# Patient Record
Sex: Female | Born: 1980 | Race: White | Hispanic: No | Marital: Married | Smoking: Former smoker
Health system: Southern US, Community
[De-identification: ages and names within clinical notes are randomized; demographics above are authoritative.]

## PROBLEM LIST (undated history)

## (undated) DIAGNOSIS — G43109 Migraine with aura, not intractable, without status migrainosus: Secondary | ICD-10-CM

## (undated) DIAGNOSIS — R87629 Unspecified abnormal cytological findings in specimens from vagina: Secondary | ICD-10-CM

## (undated) DIAGNOSIS — F419 Anxiety disorder, unspecified: Secondary | ICD-10-CM

## (undated) DIAGNOSIS — M1990 Unspecified osteoarthritis, unspecified site: Secondary | ICD-10-CM

## (undated) HISTORY — DX: Unspecified osteoarthritis, unspecified site: M19.90

## (undated) HISTORY — DX: Anxiety disorder, unspecified: F41.9

## (undated) HISTORY — DX: Migraine with aura, not intractable, without status migrainosus: G43.109

## (undated) HISTORY — PX: TUBAL LIGATION: SHX77

## (undated) HISTORY — PX: DILATION AND CURETTAGE OF UTERUS: SHX78

## (undated) HISTORY — DX: Unspecified abnormal cytological findings in specimens from vagina: R87.629

## (undated) HISTORY — PX: LEEP: SHX91

---

## 2014-06-11 ENCOUNTER — Ambulatory Visit (INDEPENDENT_AMBULATORY_CARE_PROVIDER_SITE_OTHER): Payer: BC Managed Care – PPO | Admitting: Sports Medicine

## 2014-06-11 ENCOUNTER — Encounter: Payer: Self-pay | Admitting: Sports Medicine

## 2014-06-11 ENCOUNTER — Ambulatory Visit (INDEPENDENT_AMBULATORY_CARE_PROVIDER_SITE_OTHER): Payer: BC Managed Care – PPO

## 2014-06-11 ENCOUNTER — Other Ambulatory Visit: Payer: Self-pay | Admitting: Sports Medicine

## 2014-06-11 VITALS — BP 119/81 | HR 71 | Ht 67.0 in | Wt 150.0 lb

## 2014-06-11 DIAGNOSIS — M5416 Radiculopathy, lumbar region: Secondary | ICD-10-CM

## 2014-06-11 DIAGNOSIS — M533 Sacrococcygeal disorders, not elsewhere classified: Secondary | ICD-10-CM | POA: Insufficient documentation

## 2014-06-11 MED ORDER — CYCLOBENZAPRINE HCL 10 MG PO TABS: ORAL_TABLET | ORAL | Status: DC

## 2014-06-11 MED ORDER — MELOXICAM 15 MG PO TABS: ORAL_TABLET | ORAL | Status: DC

## 2014-06-11 NOTE — Progress Notes (Signed)
   Subjective:    I'm seeing this patient as a consultation for:  Dr. Jacques NavyJennifer Sobon, [facility].  CC: Low back pain  HPI: This is a pleasant [age] year old female, she has had a long history of low back pain for years, she denies any trauma or constitutional symptoms. More recently it has started radiate down both legs, left worse than right, in an L5 distribution.  She also ascribes the pain is discogenic, worse with sitting, flexion, and Valsalva. Denies any bowel or bladder dysfunction, she has never had physical therapy, advanced imaging, or injections. Pain is moderate, persistent.  Past medical history, Surgical history, Family history not pertinant except as noted below, Social history, Allergies, and medications have been entered into the medical record, reviewed, and no changes needed.   Review of Systems: No headache, visual changes, nausea, vomiting, diarrhea, constipation, dizziness, abdominal pain, skin rash, fevers, chills, night sweats, weight loss, swollen lymph nodes, body aches, joint swelling, muscle aches, chest pain, shortness of breath, mood changes, visual or auditory hallucinations.   Objective:   General: Well Developed, well nourished, and in no acute distress.  Neuro/Psych: Alert and oriented x3, extra-ocular muscles intact, able to move all 4 extremities, sensation grossly intact. Skin: Warm and dry, no rashes noted.  Respiratory: Not using accessory muscles, speaking in full sentences, trachea midline.  Cardiovascular: Pulses palpable, no extremity edema. Abdomen: Does not appear distended. Back Exam:  Inspection: Unremarkable  Motion: Flexion 45 deg, Extension 45 deg, Side Bending to 45 deg bilaterally,  Rotation to 45 deg bilaterally  SLR laying: Positive on the left side with reproduction of radicular symptoms in an L5 distribution to the plantar aspect of the left foot.  XSLR laying: Negative  Palpable tenderness: Left sacroiliac  joint. FABER: negative. Sensory change: Gross sensation intact to all lumbar and sacral dermatomes.  Reflexes: 2+ at both patellar tendons, 2+ at achilles tendons, Babinski's downgoing.  Strength at foot  Plantar-flexion: 5/5 Dorsi-flexion: 5/5 Eversion: 5/5 Inversion: 5/5  Leg strength  Quad: 5/5 Hamstring: 5/5 Hip flexor: 5/5 Hip abductors: 5/5  Gait unremarkable.   X-rays reviewed and show L3-L4 endplate degenerative changes, there is also L5 on S1 retrolisthesis.  Impression and Recommendations:   This case required medical decision making of moderate complexity.

## 2014-06-11 NOTE — Assessment & Plan Note (Addendum)
Clinically represents a left L5 distribution. She also has some left-sided sacroiliac joint pain. Meloxicam, Flexeril at bedtime, x-rays, MRI considering duration of pain and for interventional planning. Formal physical therapy.  Return to see me to go over MRI results. Also return for custom orthotics. These have been helpful before for her low back pain.

## 2014-06-15 ENCOUNTER — Encounter: Payer: Self-pay | Admitting: Physician Assistant

## 2014-06-15 ENCOUNTER — Ambulatory Visit (INDEPENDENT_AMBULATORY_CARE_PROVIDER_SITE_OTHER): Payer: BC Managed Care – PPO | Admitting: Physician Assistant

## 2014-06-15 VITALS — BP 92/63 | HR 73 | Temp 98.6°F | Ht 67.0 in | Wt 152.0 lb

## 2014-06-15 DIAGNOSIS — M5416 Radiculopathy, lumbar region: Secondary | ICD-10-CM | POA: Diagnosis not present

## 2014-06-15 DIAGNOSIS — F411 Generalized anxiety disorder: Secondary | ICD-10-CM | POA: Diagnosis not present

## 2014-06-15 DIAGNOSIS — M549 Dorsalgia, unspecified: Secondary | ICD-10-CM | POA: Insufficient documentation

## 2014-06-16 ENCOUNTER — Other Ambulatory Visit: Payer: Self-pay | Admitting: Sports Medicine

## 2014-06-16 ENCOUNTER — Ambulatory Visit (INDEPENDENT_AMBULATORY_CARE_PROVIDER_SITE_OTHER): Payer: BC Managed Care – PPO | Admitting: Sports Medicine

## 2014-06-16 ENCOUNTER — Encounter: Payer: Self-pay | Admitting: Sports Medicine

## 2014-06-16 ENCOUNTER — Telehealth: Payer: Self-pay | Admitting: *Deleted

## 2014-06-16 VITALS — BP 110/65 | HR 62 | Ht 67.0 in | Wt 152.0 lb

## 2014-06-16 DIAGNOSIS — M5416 Radiculopathy, lumbar region: Secondary | ICD-10-CM

## 2014-06-16 MED ORDER — GABAPENTIN 300 MG PO CAPS: ORAL_CAPSULE | ORAL | Status: DC

## 2014-06-16 NOTE — Telephone Encounter (Signed)
MRI approval Berkley Harveyauth [id number] valid 06/12/14-07/11/14. Radiology notified.

## 2014-06-16 NOTE — Addendum Note (Signed)
Addended by: Monica BectonHEKKEKANDAM, THOMAS J on: 06/16/2014 11:44 AM   Modules accepted: Orders

## 2014-06-16 NOTE — Progress Notes (Signed)

## 2014-06-16 NOTE — Assessment & Plan Note (Addendum)
Left L5. Awaiting MRI. Adding gabapentin at bedtime. Museum/gallery exhibitions officerCustom Orthotics as above. Return for MRI results.

## 2014-06-17 ENCOUNTER — Encounter: Payer: Self-pay | Admitting: Physician Assistant

## 2014-06-17 DIAGNOSIS — F411 Generalized anxiety disorder: Secondary | ICD-10-CM | POA: Insufficient documentation

## 2014-06-17 MED ORDER — SERTRALINE HCL 50 MG PO TABS: 50.0000 mg | ORAL_TABLET | Freq: Every day | ORAL | Status: DC

## 2014-06-17 NOTE — Progress Notes (Signed)
   Subjective:    Patient ID: Laurie Velasquez, female    DOB: 04/01/1981, 34 y.o.   MRN: [id number]  HPI Patient is a 34 year old female who would like to establish care today.  .. Active Ambulatory Problems    Diagnosis Date Noted  . Left lumbar radiculopathy 06/11/2014  . Back pain 06/15/2014   Resolved Ambulatory Problems    Diagnosis Date Noted  . No Resolved Ambulatory Problems   No Additional Past Medical History   .Marland Kitchen.No family history on file. .. History   Social History  . Marital Status: Married    Spouse Name: N/A    Number of Children: N/A  . Years of Education: N/A   Occupational History  . Not on file.   Social History Main Topics  . Smoking status: Never Smoker   . Smokeless tobacco: Not on file  . Alcohol Use: Not on file  . Drug Use: Not on file  . Sexual Activity: Not on file   Other Topics Concern  . Not on file   Social History Narrative   Patient does have some ongoing left lumbar radiculopathy that she has seen Dr. Karie Schwalbe for.  She does have some ongoing anxiety. She takes Zoloft 50 mg once a day. She feels like her symptoms are fairly well controlled.   Review of Systems  All other systems reviewed and are negative.      Objective:   Physical Exam  Constitutional: She is oriented to person, place, and time. She appears well-developed and well-nourished.  HENT:  Head: Normocephalic and atraumatic.  Cardiovascular: Normal rate, regular rhythm and normal heart sounds.   Pulmonary/Chest: Effort normal and breath sounds normal.  Neurological: She is alert and oriented to person, place, and time.  Psychiatric: She has a normal mood and affect. Her behavior is normal.          Assessment & Plan:  GAD- GAD-7 was 7. She does not need a refill today. She's been on Zoloft for the last 3 or 4 years. Discussed she may need a increase. Patient declined any increase or change today. Will follow-up as needed. Encouraged patient to follow-up in 3 months  for complete physical.  Left lumbar radiculopathy-managed by Dr. Karie Schwalbe.  Needs CPE and labs.

## 2014-06-22 ENCOUNTER — Ambulatory Visit (INDEPENDENT_AMBULATORY_CARE_PROVIDER_SITE_OTHER): Payer: BC Managed Care – PPO

## 2014-06-22 DIAGNOSIS — M47896 Other spondylosis, lumbar region: Secondary | ICD-10-CM

## 2014-06-22 DIAGNOSIS — M5416 Radiculopathy, lumbar region: Secondary | ICD-10-CM

## 2014-06-23 ENCOUNTER — Ambulatory Visit (INDEPENDENT_AMBULATORY_CARE_PROVIDER_SITE_OTHER): Payer: BC Managed Care – PPO | Admitting: Physical Therapy

## 2014-06-23 DIAGNOSIS — M545 Low back pain: Secondary | ICD-10-CM

## 2014-06-23 DIAGNOSIS — M6281 Muscle weakness (generalized): Secondary | ICD-10-CM

## 2014-06-23 DIAGNOSIS — M5416 Radiculopathy, lumbar region: Secondary | ICD-10-CM

## 2014-06-25 ENCOUNTER — Encounter: Payer: Self-pay | Admitting: Sports Medicine

## 2014-06-25 ENCOUNTER — Ambulatory Visit (INDEPENDENT_AMBULATORY_CARE_PROVIDER_SITE_OTHER): Payer: BC Managed Care – PPO | Admitting: Sports Medicine

## 2014-06-25 VITALS — BP 116/72 | HR 68 | Wt 153.0 lb

## 2014-06-25 DIAGNOSIS — M545 Low back pain, unspecified: Secondary | ICD-10-CM

## 2014-06-25 NOTE — Assessment & Plan Note (Signed)
Pain does tend to radiate down the left leg, and she is very tender over the left sacroiliac joint. Lumbar spine MRI is completely negative. This suggests that the left sacroiliac joint is the pain generator. Continue formal physical therapy, return in one month, and if still having pain we will do a left sacroiliac joint injection under ultrasound guidance.

## 2014-06-25 NOTE — Progress Notes (Signed)
  Subjective:    CC: MRI results  HPI: Laurie Velasquez returns, she's had low back pain for some time now without any workup, she's never had physical therapy. Due to the duration of her back pain we did obtain an MRI, the results will be dictated below. Her pain is localized over the left sacroiliac joint and it did radiate down the back of her leg.  Past medical history, Surgical history, Family history not pertinant except as noted below, Social history, Allergies, and medications have been entered into the medical record, reviewed, and no changes needed.   Review of Systems: No fevers, chills, night sweats, weight loss, chest pain, or shortness of breath.   Objective:    General: Well Developed, well nourished, and in no acute distress.  Neuro: Alert and oriented x3, extra-ocular muscles intact, sensation grossly intact.  HEENT: Normocephalic, atraumatic, pupils equal round reactive to light, neck supple, no masses, no lymphadenopathy, thyroid nonpalpable.  Skin: Warm and dry, no rashes. Cardiac: Regular rate and rhythm, no murmurs rubs or gallops, no lower extremity edema.  Respiratory: Clear to auscultation bilaterally. Not using accessory muscles, speaking in full sentences. Back Exam:  Inspection: Unremarkable  Motion: Flexion 45 deg, Extension 45 deg, Side Bending to 45 deg bilaterally,  Rotation to 45 deg bilaterally  SLR laying: Negative  XSLR laying: Negative  Palpable tenderness: Left sacroiliac joint. FABER: negative. Sensory change: Gross sensation intact to all lumbar and sacral dermatomes.  Reflexes: 2+ at both patellar tendons, 2+ at achilles tendons, Babinski's downgoing.  Strength at foot  Plantar-flexion: 5/5 Dorsi-flexion: 5/5 Eversion: 5/5 Inversion: 5/5  Leg strength  Quad: 5/5 Hamstring: 5/5 Hip flexor: 5/5 Hip abductors: 5/5  Gait unremarkable.  MRI is completely negative.  Impression and Recommendations:

## 2014-06-29 ENCOUNTER — Encounter (INDEPENDENT_AMBULATORY_CARE_PROVIDER_SITE_OTHER): Payer: BC Managed Care – PPO | Admitting: Physical Therapy

## 2014-06-29 DIAGNOSIS — M6281 Muscle weakness (generalized): Secondary | ICD-10-CM

## 2014-06-29 DIAGNOSIS — M545 Low back pain: Secondary | ICD-10-CM

## 2014-06-29 DIAGNOSIS — M5416 Radiculopathy, lumbar region: Secondary | ICD-10-CM

## 2014-07-09 ENCOUNTER — Encounter (INDEPENDENT_AMBULATORY_CARE_PROVIDER_SITE_OTHER): Payer: BC Managed Care – PPO | Admitting: Physical Therapy

## 2014-07-09 DIAGNOSIS — M545 Low back pain: Secondary | ICD-10-CM | POA: Diagnosis not present

## 2014-07-09 DIAGNOSIS — M6281 Muscle weakness (generalized): Secondary | ICD-10-CM | POA: Diagnosis not present

## 2014-07-09 DIAGNOSIS — M5416 Radiculopathy, lumbar region: Secondary | ICD-10-CM | POA: Diagnosis not present

## 2014-07-13 ENCOUNTER — Encounter: Payer: BC Managed Care – PPO | Admitting: Physical Therapy

## 2014-07-23 ENCOUNTER — Ambulatory Visit (INDEPENDENT_AMBULATORY_CARE_PROVIDER_SITE_OTHER): Payer: BC Managed Care – PPO | Admitting: Physical Therapy

## 2014-07-23 ENCOUNTER — Encounter: Payer: Self-pay | Admitting: Physical Therapy

## 2014-07-23 DIAGNOSIS — M6281 Muscle weakness (generalized): Secondary | ICD-10-CM | POA: Diagnosis not present

## 2014-07-23 DIAGNOSIS — M545 Low back pain, unspecified: Secondary | ICD-10-CM

## 2014-07-23 NOTE — Patient Instructions (Signed)
Hip Extension (All-Fours)   Lift right leg back with knee slightly flexed. Do not arch neck or back. Repeat __10__ times per set. Do _2-3___ sets per session. Do __1__ sessions per day.  http://orth.exer.us/106   Copyright  VHI. All rights reserved.  Arm / Leg Extension: Alternate (All-Fours)   Raise right arm and opposite leg. Do not arch neck. Repeat __10__ times per set. Do _2-3___ sets per session. Do _1___ sessions per day.  http://orth.exer.us/110   Copyright  VHI. All rights reserved.  Hip Hike   Stand on step, right leg off step, knee straight. Raise unsupported hip, keeping knee straight. Repeat __10__ times per set. Do __3__ sets per session. Do __1__ sessions per day.  http://orth.exer.us/692   Copyright  VHI. All rights reserved.  Single Leg Raise   Lie on back, one leg bent, other leg straight on mat. Inhale, raising straight leg toward ceiling. Keep hips on mat. Exhale, lowering leg to mat. Repeat _10-20___ times. Repeat with other leg. Do __1__ sessions per day.  Copyright  VHI. All rights reserved.  The Hundred   Lie on back, legs bent, arms toward ceiling. Exhale, pressing arms down to sides, curling up head and upper torso. Hold. Pump arms in small flutters up and down. __5__ pumps per inhale, _5___ pumps per exhale. Repeat _10___ times. Do __1__ sessions per day.  Copyright  VHI. All rights reserved.

## 2014-07-23 NOTE — Therapy (Signed)
[facility] [place] [address] [place], [place], [address] Phone: [phone]   Fax:  [phone]  Physical Therapy Treatment  Patient Details  Name: Laurie Velasquez MRN: [id number] Date of Birth: [birth year] Referring Provider:  Monica Becton,*  Encounter Date: 07/23/2014      PT End of Session - 07/23/14 1616    Visit Number 4   Number of Visits 4   Date for PT Re-Evaluation 07/21/14   PT Start Time 1616   PT Stop Time 1716   PT Time Calculation (min) 60 min      History reviewed. No pertinent past medical history.  History reviewed. No pertinent past surgical history.  There were no vitals taken for this visit.  Visit Diagnosis:  Muscle weakness (generalized)  Pain of lumbar spine      Subjective Assessment - 07/23/14 1618    Symptoms patient reports tightness first thing in AM, tender in bilat SIJ's.   Patient Stated Goals reports she can sit and stand longer than when she started PT, 20-30% improvement.    Currently in Pain? Yes   Pain Score 6   no more pain in the outside of her hips   Pain Location Sacrum   Pain Descriptors / Indicators Dull;Other (Comment)  pinching   Pain Type Chronic pain   Pain Radiating Towards lower back   Pain Frequency Constant          [facility] PT Assessment - 07/23/14 0001    Assessment   Medical Diagnosis Lt lumbar radicupathy   Onset Date 07/23/08   Balance Screen   Has the patient fallen in the past 6 months No   Has the patient had a decrease in activity level because of a fear of falling?  No   Is the patient reluctant to leave their home because of a fear of falling?  No   Observation/Other Assessments   Focus on Therapeutic Outcomes (FOTO)  37%   Posture/Postural Control   Posture/Postural Control --  Rt LE slightly longer than Lt   ROM / Strength   AROM / PROM / Strength Strength   Strength   Strength Assessment Site Hip   Right/Left Hip Right;Left   Right Hip Flexion 4+/5   Right Hip Extension 4+/5   Right Hip ABduction 4/5   Left Hip Flexion 4/5   Left Hip Extension 4+/5   Left Hip ABduction 3+/5                  [facility] Adult PT Treatment/Exercise - 07/23/14 0001    Exercises   Exercises Lumbar;Knee/Hip   Lumbar Exercises: Stretches   ITB Stretch --  cross body stretch with strap supine, resting leg on a chair   Lumbar Exercises: Supine   Other Supine Lumbar Exercises pilates, 100's & single leg raise   Lumbar Exercises: Quadruped   Straight Leg Raise 20 reps  bilat   Opposite Arm/Leg Raise 20 reps  bilat   Knee/Hip Exercises: Standing   Other Standing Knee Exercises hip hiking 3x10 bilat   Modalities   Modalities Electrical Stimulation;Moist Heat   Moist Heat Therapy   Number Minutes Moist Heat 15 Minutes   Moist Heat Location --  lumbar   Electrical Stimulation   Electrical Stimulation Location lumbar   Electrical Stimulation Action IFC   Electrical Stimulation Goals Pain   Manual Therapy   Manual Therapy --  muscle energy technique to bring Rt ilium forward.  PT Education - 07/23/14 1641    Education provided Yes   Person(s) Educated Patient   Methods Explanation;Demonstration;Handout   Comprehension Returned demonstration             PT Long Term Goals - 07/23/14 1608    PT LONG TERM GOAL #1   Title demonstrate and/or verbalize techniques to reduce the risk of reinjury to include info on posture and body mechanics   Status Achieved   PT LONG TERM GOAL #2   Title I with advanced HEP   Status Achieved   PT LONG TERM GOAL #3   Title pain decrease =/< 75% at work   Status On-going   PT LONG TERM GOAL #4   Title increase strength bilat hips =/> 5-/5   Status On-going   PT LONG TERM GOAL #5   Title improve FOTO =/< 39% limited  scored 37% today   Status Achieved   Additional Long Term Goals   Additional Long Term Goals Yes   PT LONG TERM GOAL #6   Title return  to prior exercise routine without difficulty.    Status On-going               Plan - 07/23/14 1622    Clinical Impression Statement Patient has follow up with MD on Monday, she wishes to see the MD first before she decides on what she wants to do.  Is benefiting from PT however pt reports it is costly.    Rehab Potential Good   PT Next Visit Plan Patient wishes to stop PT as it is a financial burden, she has her HEP and if she performs this she will get stronger   Consulted and Agree with Plan of Care Patient        Problem List Patient Active Problem List   Diagnosis Date Noted  . GAD (generalized anxiety disorder) 06/17/2014  . Left-sided low back pain without sciatica 06/11/2014     Patient would benefit from continued PT however she wishes to D/C due to financial reasons. She will discuss with MD at her appointment on Monday    Roderic ScarceSusan Shaver, PT 07/23/2014, 6:06 PM  [facility] [address] [place], [place], [address] Phone: [phone]   Fax:  [phone]

## 2014-07-27 ENCOUNTER — Ambulatory Visit (INDEPENDENT_AMBULATORY_CARE_PROVIDER_SITE_OTHER): Payer: BC Managed Care – PPO | Admitting: Sports Medicine

## 2014-07-27 ENCOUNTER — Encounter: Payer: Self-pay | Admitting: Sports Medicine

## 2014-07-27 VITALS — BP 123/77 | HR 91 | Ht 67.0 in | Wt 150.0 lb

## 2014-07-27 DIAGNOSIS — M545 Low back pain, unspecified: Secondary | ICD-10-CM

## 2014-07-27 DIAGNOSIS — M609 Myositis, unspecified: Secondary | ICD-10-CM

## 2014-07-27 DIAGNOSIS — IMO0001 Reserved for inherently not codable concepts without codable children: Secondary | ICD-10-CM

## 2014-07-27 DIAGNOSIS — M791 Myalgia: Secondary | ICD-10-CM

## 2014-07-27 NOTE — Progress Notes (Signed)
  Subjective:    CC: Follow-up  HPI: Low back pain: Left-sided, suspicious for sacroiliac joint dysfunction and had been present for years. She went through physical therapy, we also got an MRI that was completely negative with the exception of maybe a very soft call of an L5-S1 central disc protrusion. Extremely mild, small, and not touching any of the nerve roots of the cauda equina. She has improved slightly with physical therapy, but continues to have axial pain. Moderate, persistent.  Past medical history, Surgical history, Family history not pertinant except as noted below, Social history, Allergies, and medications have been entered into the medical record, reviewed, and no changes needed.   Review of Systems: No fevers, chills, night sweats, weight loss, chest pain, or shortness of breath.   Objective:    General: Well Developed, well nourished, and in no acute distress.  Neuro: Alert and oriented x3, extra-ocular muscles intact, sensation grossly intact.  HEENT: Normocephalic, atraumatic, pupils equal round reactive to light, neck supple, no masses, no lymphadenopathy, thyroid nonpalpable.  Skin: Warm and dry, no rashes. Cardiac: Regular rate and rhythm, no murmurs rubs or gallops, no lower extremity edema.  Respiratory: Clear to auscultation bilaterally. Not using accessory muscles, speaking in full sentences.  Procedure: Real-time Ultrasound Guided Injection of left sacroiliac joint Device: GE Logiq E  Verbal informed consent obtained.  Time-out conducted.  Noted no overlying erythema, induration, or other signs of local infection.  Skin prepped in a sterile fashion.  Local anesthesia: Topical Ethyl chloride.  With sterile technique and under real time ultrasound guidance:  Spinal needle advanced over the sacral premonitory taking care to avoid the S1 foramen, I felt the needle popped into the sacroiliac joint, and 1 mL kenalog 40, 4 mL lidocaine was injected easily. Completed  without difficulty  Pain immediately resolved suggesting accurate placement of the medication.  Advised to call if fevers/chills, erythema, induration, drainage, or persistent bleeding.  Images permanently stored and available for review in the ultrasound unit.  Impression: Technically successful ultrasound guided injection.  Impression and Recommendations:

## 2014-07-27 NOTE — Assessment & Plan Note (Signed)
Pain is localized over the left sacroiliac joint. Lumbar spine MRI was negative. Improvement but insufficiently so with physical therapy. Sacral iliac joint injection under ultrasound guidance, return in one month.

## 2014-08-06 NOTE — Therapy (Signed)
[facility] [place], [place], [address] Phone: [phone]   Fax:  [phone]  Physical Therapy Treatment  Patient Details  Name: Laurie Velasquez MRN: [id number] Date of Birth: 06-27-1980 Referring Provider:  Silverio Decamp,*  Encounter Date: 07/23/2014    History reviewed. No pertinent past medical history.  History reviewed. No pertinent past surgical history.  There were no vitals filed for this visit.  Visit Diagnosis:  Muscle weakness (generalized)  Pain of lumbar spine                                  PT Long Term Goals - 07/23/14 1608    PT LONG TERM GOAL #1   Title demonstrate and/or verbalize techniques to reduce the risk of reinjury to include info on posture and body mechanics   Status Achieved   PT LONG TERM GOAL #2   Title I with advanced HEP   Status Achieved   PT LONG TERM GOAL #3   Title pain decrease =/< 75% at work   Status On-going   PT LONG TERM GOAL #4   Title increase strength bilat hips =/> 5-/5   Status On-going   PT LONG TERM GOAL #5   Title improve FOTO =/< 39% limited  scored 37% today   Status Achieved   Additional Long Term Goals   Additional Long Term Goals Yes   PT LONG TERM GOAL #6   Title return to prior exercise routine without difficulty.    Status On-going               Problem List Patient Active Problem List   Diagnosis Date Noted  . GAD (generalized anxiety disorder) 06/17/2014  . Left-sided low back pain without sciatica 06/11/2014  PHYSICAL THERAPY DISCHARGE SUMMARY  Visits from Start of Care: 4  Current functional level related to goals / functional outcomes: FOTO 37% limited   Remaining deficits: unknown   Education / Equipment: HEP Plan: Patient agrees to discharge.  Patient goals were partially met. Patient is being discharged due to financial reasons.  ?????      Jeral Pinch,  PT 08/06/2014, 12:50 PM  [facility] [place], [place], [address] Phone: [phone]   Fax:  [phone]

## 2014-08-24 ENCOUNTER — Ambulatory Visit: Payer: BC Managed Care – PPO | Admitting: Sports Medicine

## 2014-08-28 ENCOUNTER — Encounter: Payer: Self-pay | Admitting: Sports Medicine

## 2014-08-28 ENCOUNTER — Ambulatory Visit (INDEPENDENT_AMBULATORY_CARE_PROVIDER_SITE_OTHER): Payer: BC Managed Care – PPO | Admitting: Sports Medicine

## 2014-08-28 VITALS — BP 124/81 | HR 70 | Ht 67.0 in | Wt 144.0 lb

## 2014-08-28 DIAGNOSIS — M47816 Spondylosis without myelopathy or radiculopathy, lumbar region: Secondary | ICD-10-CM | POA: Insufficient documentation

## 2014-08-28 DIAGNOSIS — M533 Sacrococcygeal disorders, not elsewhere classified: Secondary | ICD-10-CM

## 2014-08-28 DIAGNOSIS — M47896 Other spondylosis, lumbar region: Secondary | ICD-10-CM | POA: Diagnosis not present

## 2014-08-28 NOTE — Assessment & Plan Note (Signed)
Excellent response to left sided sacroiliac joint under ultrasound guidance with resolution of hip pain. We will keep this in mind as a future interventional target if needed.

## 2014-08-28 NOTE — Progress Notes (Signed)
  Subjective:    CC: Follow-up  HPI: Laurie Velasquez returns to discuss her low back pain, she had a fantastic response with resolution of all hip pain after a sacroiliac joint injection on the left side. 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  Past medical history, Surgical history, Family history not pertinant except as noted below, Social history, Allergies, and medications have been entered into the medical record, reviewed, and no changes needed.   Review of Systems: No fevers, chills, night sweats, weight loss, chest pain, or shortness of breath.   Objective:    General: Well Developed, well nourished, and in no acute distress.  Neuro: Alert and oriented x3, extra-ocular muscles intact, sensation grossly intact.  HEENT: Normocephalic, atraumatic, pupils equal round reactive to light, neck supple, no masses, no lymphadenopathy, thyroid nonpalpable.  Skin: Warm and dry, no rashes. Cardiac: Regular rate and rhythm, no murmurs rubs or gallops, no lower extremity edema.  Respiratory: Clear to auscultation bilaterally. Not using accessory muscles, speaking in full sentences. Back Exam:  Inspection: Unremarkable  Motion: Flexion 45 deg, Extension 45 deg, Side Bending to 45 deg bilaterally,  Rotation to 45 deg bilaterally  SLR laying: Negative  XSLR laying: Negative  Palpable tenderness: Bilateral in the lower 3-4 lumbar segments. FABER: negative. Sensory change: Gross sensation intact to all lumbar and sacral dermatomes.  Reflexes: 2+ at both patellar tendons, 2+ at achilles tendons, Babinski's downgoing.  Strength at foot  Plantar-flexion: 5/5 Dorsi-flexion: 5/5 Eversion: 5/5 Inversion: 5/5  Leg strength  Quad: 5/5 Hamstring: 5/5 Hip flexor: 5/5 Hip abductors: 5/5  Gait unremarkable.  Impression and Recommendations:

## 2014-08-28 NOTE — Assessment & Plan Note (Signed)
Resolution of hip pain with SI injection. Next line persistent mid to upper back pain. She does have L3-S1 bilateral facet hypertrophy, we are going to now proceed with multilevel facet injections. Return to see me in 3 weeks to evaluate response.

## 2015-01-30 ENCOUNTER — Other Ambulatory Visit: Payer: Self-pay | Admitting: Physician Assistant

## 2015-01-31 ENCOUNTER — Emergency Department
Admission: EM | Admit: 2015-01-31 | Discharge: 2015-01-31 | Disposition: A | Discharge status: Home / Self Care | Payer: BC Managed Care – PPO | Source: Home / Self Care | Attending: Family Medicine | Admitting: Family Medicine

## 2015-01-31 ENCOUNTER — Encounter: Payer: Self-pay | Admitting: Emergency Medicine

## 2015-01-31 ENCOUNTER — Emergency Department (INDEPENDENT_AMBULATORY_CARE_PROVIDER_SITE_OTHER): Payer: BC Managed Care – PPO

## 2015-01-31 DIAGNOSIS — M67471 Ganglion, right ankle and foot: Secondary | ICD-10-CM

## 2015-01-31 DIAGNOSIS — M79671 Pain in right foot: Secondary | ICD-10-CM

## 2015-01-31 MED ORDER — AZITHROMYCIN 250 MG PO TABS: 1000.0000 mg | ORAL_TABLET | Freq: Every day | ORAL | Status: DC

## 2015-01-31 MED ORDER — HYDROCODONE-ACETAMINOPHEN 5-325 MG PO TABS: 1.0000 | ORAL_TABLET | ORAL | Status: DC | PRN

## 2015-01-31 NOTE — ED Notes (Signed)
Pt c/o right foot pain for several weeks.  No apparent injury, knot on top of foot.

## 2015-01-31 NOTE — Discharge Instructions (Signed)
°  Norco/Vicodin (hydrocodone-acetaminophen) is a narcotic pain medication, do not combine these medications with others containing tylenol. While taking, do not drink alcohol, drive, or perform any other activities that requires focus while taking these medications.  ° °

## 2015-01-31 NOTE — ED Provider Notes (Signed)
CSN: [id number]     Arrival date & time 01/31/15  1130 History   First MD Initiated Contact with Patient 01/31/15 1130     Chief Complaint  Patient presents with  . Foot Pain   (Consider location/radiation/quality/duration/timing/severity/associated sxs/prior Treatment) HPI Pt is a 33yo female presenting to [facility] with c/o Right foot pain that started several weeks ago. Pain is intermittent, aching and sore, worse with palpation and ambulation.  Pain is 8/10 at worst. She does take meloxicam daily for her back pain but denies taking any other pain medication for her foot. She noticed a small nodule on the top of her foot several days ago and states it is where the majority of her pain is. Pt does wear closed toed shoes that provide good arch support. Denies recent new shoes. Denies known injury or trauma to her foot. Denies prior hx of nodules to foot.   PCP: Tandy Gaw, PA-C Sports Medicine: Dr. Benjamin Stain  History reviewed. No pertinent past medical history. History reviewed. No pertinent past surgical history. No family history on file. Social History  Substance Use Topics  . Smoking status: Former Smoker    Quit date: 05/30/2007  . Smokeless tobacco: None  . Alcohol Use: 0.0 oz/week    0 Standard drinks or equivalent per week     Comment: 2 drinks a week   OB History    No data available     Review of Systems  Musculoskeletal: Positive for myalgias, joint swelling, arthralgias and gait problem ( pain). Negative for back pain.       Right foot pain, nodule  Skin: Negative for color change and wound.  Neurological: Negative for weakness and numbness.    Allergies  Penicillins  Home Medications   Prior to Admission medications   Medication Sig Start Date End Date Taking? Authorizing Provider  HYDROcodone-acetaminophen (NORCO/VICODIN) 5-325 MG per tablet Take 1 tablet by mouth every 4 (four) hours as needed for moderate pain or severe pain. 01/31/15   Junius Finner, PA-C    sertraline (ZOLOFT) 50 MG tablet Take 1 tablet (50 mg total) by mouth daily. 06/17/14   Jomarie Longs, PA-C   Meds Ordered and Administered this Visit  Medications - No data to display  BP 117/82 mmHg  Pulse 79  Temp(Src) 99 F (37.2 C) (Oral)  Ht  (1.702 m)  Wt 146 lb (66.225 kg)  BMI 22.86 kg/m2  SpO2 100%  LMP 01/19/2014 No data found.   Physical Exam  Constitutional: She is oriented to person, place, and time. She appears well-developed and well-nourished.  HENT:  Head: Normocephalic and atraumatic.  Eyes: EOM are normal.  Neck: Normal range of motion.  Cardiovascular: Normal rate.   Pulmonary/Chest: Effort normal.  Musculoskeletal: Normal range of motion. She exhibits tenderness. She exhibits no edema.       Right foot: There is tenderness.       Feet:  Right foot: 0.5cm hard nodule over 4th-5th metatarsal on dorsal aspect. Tender to touch. Non-mobile. FROM Right ankle and toes. Normal gait.  Neurological: She is alert and oriented to person, place, and time.  Right foot: sensation normal  Skin: Skin is warm and dry. No erythema.  Right foot: Skin in tact. No ecchymosis, erythema, or warmth. No red streaking, induration, or evidence of underlying infection.  Psychiatric: She has a normal mood and affect. Her behavior is normal.  Nursing note and vitals reviewed.   ED Course  Procedures (including critical care time)  Labs Review Labs Reviewed - No data to display  Imaging Review Dg Foot Complete Right  01/31/2015   CLINICAL DATA:  Right foot pain the past 4 days with no known injury. Small mass on the dorsal aspect of the foot in the region of the proximal fourth and fifth metatarsals.  EXAM: RIGHT FOOT COMPLETE - 3+ VIEW  COMPARISON:  None.  FINDINGS: Large os naviculare. There is no evidence of fracture or dislocation. There is no evidence of arthropathy or other focal bone abnormality. Soft tissues are unremarkable.  IMPRESSION: No significant abnormality.    Electronically Signed   By: Beckie Salts M.D.   On: 01/31/2015 12:27      MDM   1. Ganglion cyst of right foot   2. Right foot pain    Pt c/o several week hx of Right foot pain, now noticing a hard nodule on top of foot. No evidence of underlying infection. No hx of known trauma. Plain films: negative for bone abnormality.  Exam most c/w dorsal foot ganglion. Discussed different treatment options with pt including rest with observation and adding cushioning around area to help prevent friction rub from shoes; aspiration and injection of corticosteroids or surgery if needed. Advised pt to call to schedule f/u appointment with Dr. Benjamin Stain, Sports Medicine for further evaluation and treatment. Rx: norco  She may continue her home meloxicam. Patient counseled on use of narcotic pain medications. Counseled not to combine these medications with others containing tylenol. Urged not to drink alcohol, drive, or perform any other activities that requires focus while taking these medications.  Patient verbalized understanding and agreement with treatment plan.       Jarica Plass, PA-C 01/31/15 1246

## 2015-07-05 ENCOUNTER — Ambulatory Visit (INDEPENDENT_AMBULATORY_CARE_PROVIDER_SITE_OTHER): Payer: BC Managed Care – PPO | Admitting: Physician Assistant

## 2015-07-05 ENCOUNTER — Encounter: Payer: Self-pay | Admitting: Physician Assistant

## 2015-07-05 VITALS — BP 120/70 | HR 88 | Ht 67.0 in | Wt 148.0 lb

## 2015-07-05 DIAGNOSIS — F411 Generalized anxiety disorder: Secondary | ICD-10-CM | POA: Diagnosis not present

## 2015-07-05 DIAGNOSIS — Z3490 Encounter for supervision of normal pregnancy, unspecified, unspecified trimester: Secondary | ICD-10-CM

## 2015-07-05 DIAGNOSIS — F410 Panic disorder [episodic paroxysmal anxiety] without agoraphobia: Secondary | ICD-10-CM | POA: Diagnosis not present

## 2015-07-05 DIAGNOSIS — R110 Nausea: Secondary | ICD-10-CM | POA: Diagnosis not present

## 2015-07-05 DIAGNOSIS — O99341 Other mental disorders complicating pregnancy, first trimester: Secondary | ICD-10-CM | POA: Diagnosis not present

## 2015-07-05 DIAGNOSIS — F32A Depression, unspecified: Secondary | ICD-10-CM

## 2015-07-05 DIAGNOSIS — F329 Major depressive disorder, single episode, unspecified: Secondary | ICD-10-CM

## 2015-07-05 MED ORDER — HYDROXYZINE HCL 25 MG PO TABS: 25.0000 mg | ORAL_TABLET | Freq: Three times a day (TID) | ORAL | Status: DC | PRN

## 2015-07-05 MED ORDER — DOXYLAMINE-PYRIDOXINE 10-10 MG PO TBEC: DELAYED_RELEASE_TABLET | ORAL | Status: DC

## 2015-07-05 MED ORDER — SERTRALINE HCL 50 MG PO TABS: 50.0000 mg | ORAL_TABLET | Freq: Every day | ORAL | Status: DC

## 2015-07-05 NOTE — Progress Notes (Addendum)
   Subjective:    Patient ID: Laurie Felts, female    DOB: 1981-03-31, 35 y.o.   MRN: [id number]  Anxiety    Patient is a 35 year old female that presents to the clinic with complaint of generalized anxiety, panic attacks and nausea. Patient states that she is approximately four weeks pregnant. Pt weaned herself off zoloft and xanax preparing to get pregnant. Patient states that she has been increasingly tearful, anxious, and unable to calm herself down. 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 She has had to leave the room or just break out crying at work.  Patient is interested in resuming Zoloft for generalized anxiety management. Patient is aware of the risks and alternatives of SSRIs for treatment of GAD in pregnancy. Patient denies suicidal and homicidal ideation. Patient also has constant nausea throughout the day, which is not relieved by snacks and rest.   Review of Systems    Please see HPI Objective:   Physical Exam  Constitutional: She is oriented to person, place, and time. She appears well-developed and well-nourished.  Patient appears tearful.   HENT:  Head: Normocephalic and atraumatic.  Right Ear: External ear normal.  Left Ear: External ear normal.  Nose: Nose normal.  Mouth/Throat: Oropharynx is clear and moist.  Eyes: Conjunctivae and EOM are normal. Pupils are equal, round, and reactive to light. Right eye exhibits no discharge. Left eye exhibits no discharge.  Neck: Normal range of motion. Neck supple. No tracheal deviation present. No thyromegaly present.  Cardiovascular: Normal rate, regular rhythm, normal heart sounds and intact distal pulses.   Pulmonary/Chest: Effort normal and breath sounds normal. No respiratory distress. She has no wheezes. She has no rales. She exhibits no tenderness.  Abdominal: Soft. Bowel sounds are normal. She exhibits no distension. There is no tenderness. There  is no rebound and no guarding.  Musculoskeletal: Normal range of motion.  Lymphadenopathy:    She has no cervical adenopathy.  Neurological: She is alert and oriented to person, place, and time. No cranial nerve deficit.  Skin: Skin is warm and dry. She is not diaphoretic.  Psychiatric: Her behavior is normal. Judgment and thought content normal.          Assessment & Plan:   1. Generalized Anxiety/panic attacks/Depression- PHQ-9 was 23. GAD-7 was19. Patient will resume Zoloft (50 mg) daily. Discussed risk vs benefits. At this time benefits outweigh risks. For panic attacks will try hydroxyzine  as needed up to three times a day. Sedation warning discussed.  Encouraged daily exercise.  Follow up in 1 month or sooner if needed.   2. Nausea-likely associated with pregnancy but could be worsened by anxiety as well. Patient will be started on Diclegis for nausea. Discuss to start at 2 tablets at bedtime and then increase as needed up to 4 tabs a day.   3. Pregnancy- pt is on prenatal vitamins. She brings in a nerium supplement with EHT and huperzine in it. I am not familiar with these products and safety in pregnancy. Follow up with OB. First appt in 4 weeks.

## 2015-07-05 NOTE — Progress Notes (Deleted)
   Subjective:    Patient ID: Laurie Velasquez, female    DOB: [birth year], 35 y.o.   MRN: [id number]  HPI  Zoloft.   Review of Systems     Objective:   Physical Exam        Assessment & Plan:  Nausea and vomiting-

## 2015-07-06 DIAGNOSIS — F410 Panic disorder [episodic paroxysmal anxiety] without agoraphobia: Secondary | ICD-10-CM | POA: Insufficient documentation

## 2015-07-06 DIAGNOSIS — Z3490 Encounter for supervision of normal pregnancy, unspecified, unspecified trimester: Secondary | ICD-10-CM | POA: Insufficient documentation

## 2015-07-06 DIAGNOSIS — F329 Major depressive disorder, single episode, unspecified: Secondary | ICD-10-CM | POA: Insufficient documentation

## 2015-07-06 DIAGNOSIS — F32A Depression, unspecified: Secondary | ICD-10-CM | POA: Insufficient documentation

## 2015-07-06 DIAGNOSIS — R110 Nausea: Secondary | ICD-10-CM | POA: Insufficient documentation

## 2015-07-06 NOTE — Addendum Note (Signed)
Addended byJomarie Longs on: 07/06/2015 07:52 AM   Modules accepted: Medications, Level of Service

## 2015-10-04 ENCOUNTER — Ambulatory Visit: Payer: BC Managed Care – PPO | Admitting: Physician Assistant

## 2015-10-21 ENCOUNTER — Ambulatory Visit (INDEPENDENT_AMBULATORY_CARE_PROVIDER_SITE_OTHER): Payer: BC Managed Care – PPO | Admitting: Osteopathic Medicine

## 2015-10-21 ENCOUNTER — Encounter: Payer: Self-pay | Admitting: Osteopathic Medicine

## 2015-10-21 VITALS — BP 114/75 | HR 80 | Ht 67.0 in | Wt 155.0 lb

## 2015-10-21 DIAGNOSIS — J329 Chronic sinusitis, unspecified: Secondary | ICD-10-CM | POA: Diagnosis not present

## 2015-10-21 DIAGNOSIS — Z79899 Other long term (current) drug therapy: Secondary | ICD-10-CM

## 2015-10-21 DIAGNOSIS — B349 Viral infection, unspecified: Secondary | ICD-10-CM | POA: Diagnosis not present

## 2015-10-21 DIAGNOSIS — B9789 Other viral agents as the cause of diseases classified elsewhere: Secondary | ICD-10-CM

## 2015-10-21 DIAGNOSIS — N61 Mastitis without abscess: Secondary | ICD-10-CM | POA: Diagnosis not present

## 2015-10-21 LAB — POCT URINE PREGNANCY: Preg Test, Ur: NEGATIVE

## 2015-10-21 MED ORDER — SULFAMETHOXAZOLE-TRIMETHOPRIM 800-160 MG PO TABS: 1.0000 | ORAL_TABLET | Freq: Two times a day (BID) | ORAL | Status: DC

## 2015-10-21 MED ORDER — IPRATROPIUM BROMIDE 0.03 % NA SOLN: 2.0000 | Freq: Three times a day (TID) | NASAL | Status: DC

## 2015-10-21 NOTE — Progress Notes (Signed)
HPI: Laurie Velasquez is a 35 y.o. female who [facility] today for chief complaint of:  Chief Complaint  Patient presents with  . Breast Pain    left x 2 weeks    BREAST PAIN . Location: left breast at nipple radiating laterally . Quality: sharp/sore pain . Severity: severe per patient . Duration: 2 weeks . Timing: tender worse with touching, but no relation to breathing or muscle movement . Context: feels exactly like previous episode mastitis . Modifying factors: . Assoc signs/symptoms: no fever/chills. No breast mass/ulcer, no nipple discharge  EAR PAIN . Location: b/l ears . Quality: soreness . Duration: few days . Context: allergies, possible cold . Modifying factors: Zyrtec occasionally taking    Past medical, social and family history reviewed: No past medical history on file. No past surgical history on file. Social History  Substance Use Topics  . Smoking status: Former Smoker    Quit date: 05/30/2007  . Smokeless tobacco: Not on file  . Alcohol Use: 0.0 oz/week    0 Standard drinks or equivalent per week     Comment: 2 drinks a week   No family history on file.  Current Outpatient Prescriptions  Medication Sig Dispense Refill  . sertraline (ZOLOFT) 50 MG tablet Take 1 tablet (50 mg total) by mouth daily. 90 tablet 0   No current facility-administered medications for this visit.   Allergies  Allergen Reactions  . Penicillins       Review of Systems: CONSTITUTIONAL:  No  fever, no chills HEAD/EYES/EARS/NOSE/THROAT: No  headache, no vision change, no hearing change, No  sore throat, (+) ear soreness and sinus pressure CARDIAC: No  chest pain, No  pressure, No palpitations, No  orthopnea  RESPIRATORY: (+) few days mild cough, No  shortness of breath/wheeze GASTROINTESTINAL: No  nausea, MUSCULOSKELETAL: No  myalgia/arthralgia GENITOURINARY: No  incontinence, No  abnormal genital bleeding/discharge SKIN: No   rash/wounds/concerning lesions  Exam:  BP 114/75 mmHg  Pulse 80  Ht 5\' 7"  (1.702 m)  Wt 155 lb (70.308 kg)  BMI 24.27 kg/m2  LMP 01/19/2014 Constitutional: VS see above. General Appearance: alert, well-developed, well-nourished, NAD Eyes: Normal lids and conjunctive, non-icteric sclera, Ears, Nose, Mouth, Throat: MMM, Normal external inspection ears/nares/mouth/lips/gums, TM normal bilaterally with slight clear effusion behind L TM.  Neck: No masses, trachea midline Respiratory: Normal respiratory effort.  Skin: warm, dry, intact. No rash/ulcer. No concerning nevi or subq nodules on limited exam.   Breast: Normal fibrocystic breast tissue, no nipple retraction or discharge, no ulceration or erythema, patient is tender on left breast at about 4 to 5:00 position but no mass or subcutaneous changes as is appreciated Psychiatric: Normal judgment/insight. Normal mood and affect. Oriented x3.    Results for orders placed or performed in visit on 10/21/15 (from the past 72 hour(s))  POCT urine pregnancy     Status: None   Collection Time: 10/21/15  1:35 PM  Result Value Ref Range   Preg Test, Ur Negative Negative     ASSESSMENT/PLAN: Will trial abx, based on her history of previous mastitis states this feels exactly like that, not worse with counhing or deep breathing, or arm/shoulder movements so doesn't appear MSK related though this could be the case. If abx not helpful, would consider imaging but I do not note any clinical findings which would warrant US or diagnostic mammo at this point - f/u with PCP.   No ear infection, eustachian tube dysfunction/viral URI,  recommend consistent Zyrtec use and will trial nasal spray.   Mastitis in female - Plan: sulfamethoxazole-trimethoprim (BACTRIM DS,SEPTRA DS) 800-160 MG tablet  Viral sinusitis - Plan: ipratropium (ATROVENT) 0.03 % nasal spray  Medication management - Plan: POCT urine pregnancy   All questions were answered. Visit summary  with updated medication list and pertinent instructions was printed for patient. ER/RTC precautions were reviewed with the patient. Return if symptoms worsen or fail to improve - follow up with [facility] for further workup/recommendation..  Note, patient stated that she is running low on antidepressants, I advised she have her pharmacy call in a refill request to be routed to her PCP for this chronic issue

## 2015-10-21 NOTE — Patient Instructions (Signed)
Mastitis  Mastitis is inflammation of the breast tissue. It occurs most often in women who are breastfeeding, but it can also affect other women, and even sometimes men.  CAUSES   Mastitis is usually caused by a bacterial infection. Bacteria enter the breast tissue through cuts or openings in the skin. Typically, this occurs with breastfeeding because of cracked or irritated skin. Sometimes, it can occur even when there is no opening in the skin. It can be associated with plugged milk (lactiferous) ducts. Nipple piercing can also lead to mastitis. Also, some forms of breast cancer can cause mastitis.  SIGNS AND SYMPTOMS   · Swelling, redness, tenderness, and pain in an area of the breast.  · Swelling of the glands under the arm on the same side.  · Fever.  If an infection is allowed to progress, a collection of pus (abscess) may develop.  DIAGNOSIS   Your health care provider can usually diagnose mastitis based on your symptoms and a physical exam. Tests may be done to help confirm the diagnosis. These may include:   · Removal of pus from the breast by applying pressure to the area. This pus can be examined in the lab to determine which bacteria are present. If an abscess has developed, the fluid in the abscess can be removed with a needle. This can also be used to confirm the diagnosis and determine the bacteria present. In most cases, pus will not be present.  · Blood tests to determine if your body is fighting a bacterial infection.  · Mammogram or ultrasound tests to rule out other problems or diseases.  TREATMENT   Antibiotic medicine is used to treat a bacterial infection. Your health care provider will determine which bacteria are most likely causing the infection and will select an appropriate antibiotic. This is sometimes changed based on the results of tests performed to identify the bacteria, or if there is no response to the antibiotic selected. Antibiotics are usually given by mouth. You may also be  given medicine for pain.  Mastitis that occurs with breastfeeding will sometimes go away on its own, so your health care provider may choose to wait 24 hours after first seeing you to decide whether a prescription medicine is needed.  HOME CARE INSTRUCTIONS   · Only take over-the-counter or prescription medicines for pain, fever, or discomfort as directed by your health care provider.  · If your health care provider prescribed an antibiotic, take the medicine as directed. Make sure you finish it even if you start to feel better.  · Do not wear a tight or underwire bra. Wear a soft, supportive bra.  · Increase your fluid intake, especially if you have a fever.  · Women who are breastfeeding should follow these instructions:    Continue to empty the breast. Your health care provider can tell you whether this milk is safe for your infant or needs to be thrown out. You may be told to stop nursing until your health care provider thinks it is safe for your baby. Use a breast pump if you are advised to stop nursing.    Keep your nipples clean and dry.    Empty the first breast completely before going to the other breast. If your baby is not emptying your breasts completely for some reason, use a breast pump to empty your breasts.    If you go back to work, pump your breasts while at work to stay in time with your nursing schedule.      Avoid allowing your breasts to become overly filled with milk (engorged).  SEEK MEDICAL CARE IF:   · You have pus-like discharge from the breast.  · Your symptoms do not improve with the treatment prescribed by your health care provider within 2 days.  SEEK IMMEDIATE MEDICAL CARE IF:   · Your pain and swelling are getting worse.  · You have pain that is not controlled with medicine.  · You have a red line extending from the breast toward your armpit.  · You have a fever or persistent symptoms for more than 2-3 days.  · You have a fever and your symptoms suddenly get worse.     This information  is not intended to replace advice given to you by your health care provider. Make sure you discuss any questions you have with your health care provider.     Document Released: 05/15/2005 Document Revised: 05/20/2013 Document Reviewed: 12/13/2012  Elsevier Interactive Patient Education ©2016 [employer]

## 2015-10-31 ENCOUNTER — Other Ambulatory Visit: Payer: Self-pay | Admitting: Physician Assistant

## 2015-12-04 ENCOUNTER — Other Ambulatory Visit: Payer: Self-pay | Admitting: Physician Assistant

## 2016-01-07 ENCOUNTER — Other Ambulatory Visit: Payer: Self-pay | Admitting: Physician Assistant

## 2016-02-14 ENCOUNTER — Other Ambulatory Visit: Payer: Self-pay | Admitting: Physician Assistant

## 2016-02-22 ENCOUNTER — Ambulatory Visit (INDEPENDENT_AMBULATORY_CARE_PROVIDER_SITE_OTHER): Payer: BC Managed Care – PPO | Admitting: Osteopathic Medicine

## 2016-02-22 ENCOUNTER — Encounter: Payer: Self-pay | Admitting: Osteopathic Medicine

## 2016-02-22 VITALS — BP 124/78 | HR 73 | Temp 98.4°F | Ht 67.0 in | Wt 146.0 lb

## 2016-02-22 DIAGNOSIS — A499 Bacterial infection, unspecified: Secondary | ICD-10-CM | POA: Diagnosis not present

## 2016-02-22 DIAGNOSIS — J329 Chronic sinusitis, unspecified: Secondary | ICD-10-CM | POA: Diagnosis not present

## 2016-02-22 DIAGNOSIS — B9689 Other specified bacterial agents as the cause of diseases classified elsewhere: Secondary | ICD-10-CM

## 2016-02-22 MED ORDER — CEFDINIR 300 MG PO CAPS: 300.0000 mg | ORAL_CAPSULE | Freq: Two times a day (BID) | ORAL | 0 refills | Status: DC

## 2016-02-22 MED ORDER — DOXYCYCLINE MONOHYDRATE 100 MG PO CAPS: 100.0000 mg | ORAL_CAPSULE | Freq: Two times a day (BID) | ORAL | 0 refills | Status: DC

## 2016-02-22 NOTE — Progress Notes (Signed)
HPI: Laurie Velasquez is a 35 y.o. female who [facility]  today for chief complaint of:  Chief Complaint  Patient presents with  . Sinus Problem    Acute Illness: . Context:  Allergies but has been sick with sinus pain for 2 weeks . Location: bilateral sinuses  . Quality: congestion, pain . Assoc signs/symptoms: see ROS . Duration: 14+ days . Modifying factors: has tried the following OTC/Rx medications: Flonase, Mucinex   Past medical, social and family history reviewed. Current medications and allergies reviewed.     Review of Systems:  Constitutional: no fever/chills  HEENT: +sinus headache, no vision change or hearing change, no sore throat  Cardiovascular: No chest pain, No pressure/palpitations  Respiratory: no cough, no shortness of breath  Gastrointestinal: no nausea, no vomiting, no abdominal pain, no diarrhea  Musculoskeletal: no myalgia/arthralgia  Skin/Integument: no rash   Exam:  BP 124/78   Pulse 73   Temp 98.4 F (36.9 C) (Oral)   Ht 5\' 7"  (1.702 m)   Wt 146 lb (66.2 kg)   BMI 22.87 kg/m   Constitutional: VSS, see above. General Appearance: alert, well-developed, well-nourished, NAD  Eyes: Normal lids and conjunctive, non-icteric sclera, PERRLA  Ears, Nose, Mouth, Throat: Normal external inspection ears/nares/mouth/lips/gums, normal TM, MMM; posterior pharynx without erythema, without exudate, nasal mucosa normal  Neck: No masses, trachea midline. normal lymph nodes  Respiratory: Normal respiratory effort. No  wheeze/rhonchi/rales  Cardiovascular: S1/S2 normal, no murmur/rub/gallop auscultated. RRR.    ASSESSMENT/PLAN: Ok to try limited use Afrin for severe congestion. Switched to [place]mn[place]f, patient has no history of anaphylaxis that she is aware of. Doxycycline ill advised in someone trying to get pregnant.  Bacterial sinusitis - Plan: cefdinir (OMNICEF) 300 MG capsule, DISCONTINUED: doxycycline  (MONODOX) 100 MG capsule  ER/RTC precautions reviewed. All questions answered. No Follow-up on file.

## 2016-03-03 ENCOUNTER — Encounter: Payer: Self-pay | Admitting: Physician Assistant

## 2016-03-03 ENCOUNTER — Ambulatory Visit (INDEPENDENT_AMBULATORY_CARE_PROVIDER_SITE_OTHER): Payer: BC Managed Care – PPO | Admitting: Physician Assistant

## 2016-03-03 VITALS — BP 115/65 | HR 72 | Ht 67.0 in | Wt 147.0 lb

## 2016-03-03 DIAGNOSIS — F331 Major depressive disorder, recurrent, moderate: Secondary | ICD-10-CM

## 2016-03-03 DIAGNOSIS — Z23 Encounter for immunization: Secondary | ICD-10-CM | POA: Diagnosis not present

## 2016-03-03 DIAGNOSIS — F411 Generalized anxiety disorder: Secondary | ICD-10-CM

## 2016-03-03 MED ORDER — SERTRALINE HCL 50 MG PO TABS: 50.0000 mg | ORAL_TABLET | Freq: Every day | ORAL | 3 refills | Status: DC

## 2016-03-03 NOTE — Progress Notes (Signed)
   Subjective:    Patient ID: Laurie Velasquez, female    DOB: 1980-07-26, 35 y.o.   MRN: [id number]  HPI Pt is a 35 yo female who presents to the clinic to follow up on depression and anxiety. She is doing really good. After she started zoloft she had a miscarriage. She feels like she has stayed up beat through the last few months. She denies any side effects. No suicidal or homicidal thoughts. She is doing well at work and overall happy. They are actively trying to get pregnant again.    Review of Systems  All other systems reviewed and are negative.      Objective:   Physical Exam  Constitutional: She is oriented to person, place, and time. She appears well-developed and well-nourished.  HENT:  Head: Normocephalic and atraumatic.  Cardiovascular: Normal rate, regular rhythm and normal heart sounds.   Pulmonary/Chest: Effort normal and breath sounds normal.  Neurological: She is alert and oriented to person, place, and time.  Psychiatric: She has a normal mood and affect. Her behavior is normal.          Assessment & Plan:  MDD/GAD- PHQ-9 was 4 and GAD-7 was 4. zoloft refilled for one year. Follow up as needed.

## 2016-04-08 IMAGING — CR DG LUMBAR SPINE COMPLETE 4+V
6 series · 6 of 6 positions shown · non-contrast
Comparison: None.

CLINICAL DATA: Low back pain for 5 years, left radiculopathy, no
known injury

EXAM:
LUMBAR SPINE - COMPLETE 4+ VIEW

[view not recorded (1 of 6)]
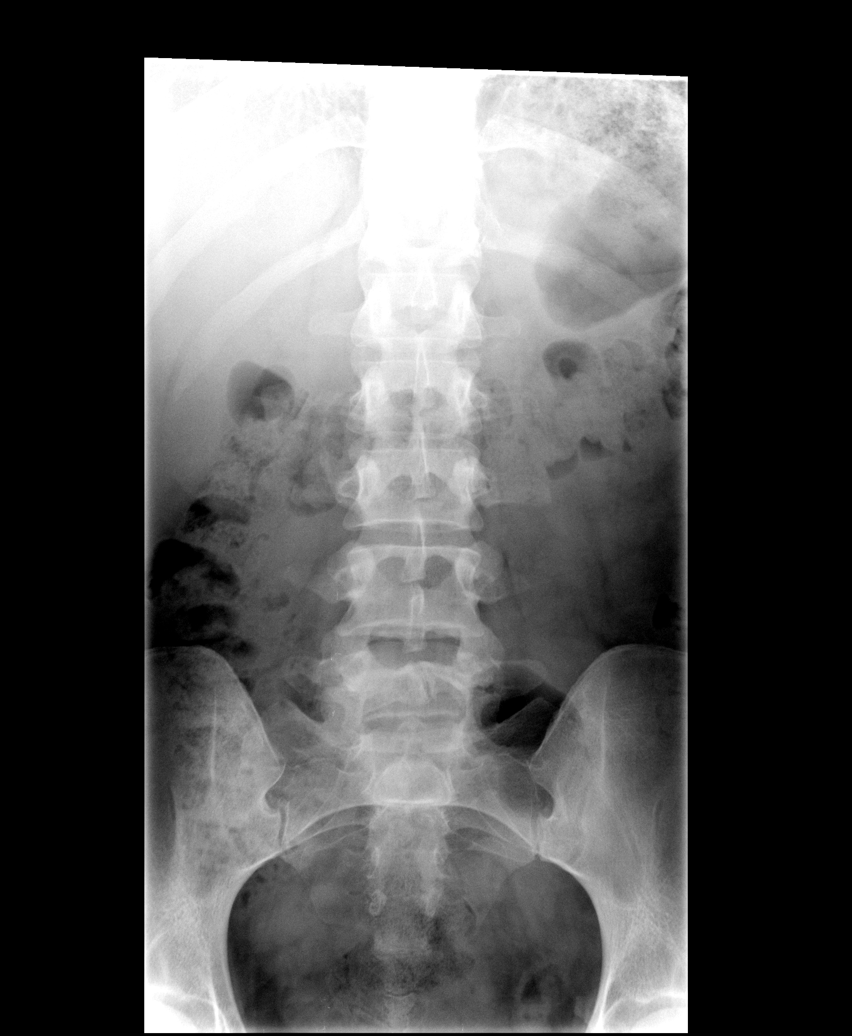

[view not recorded (2 of 6)]
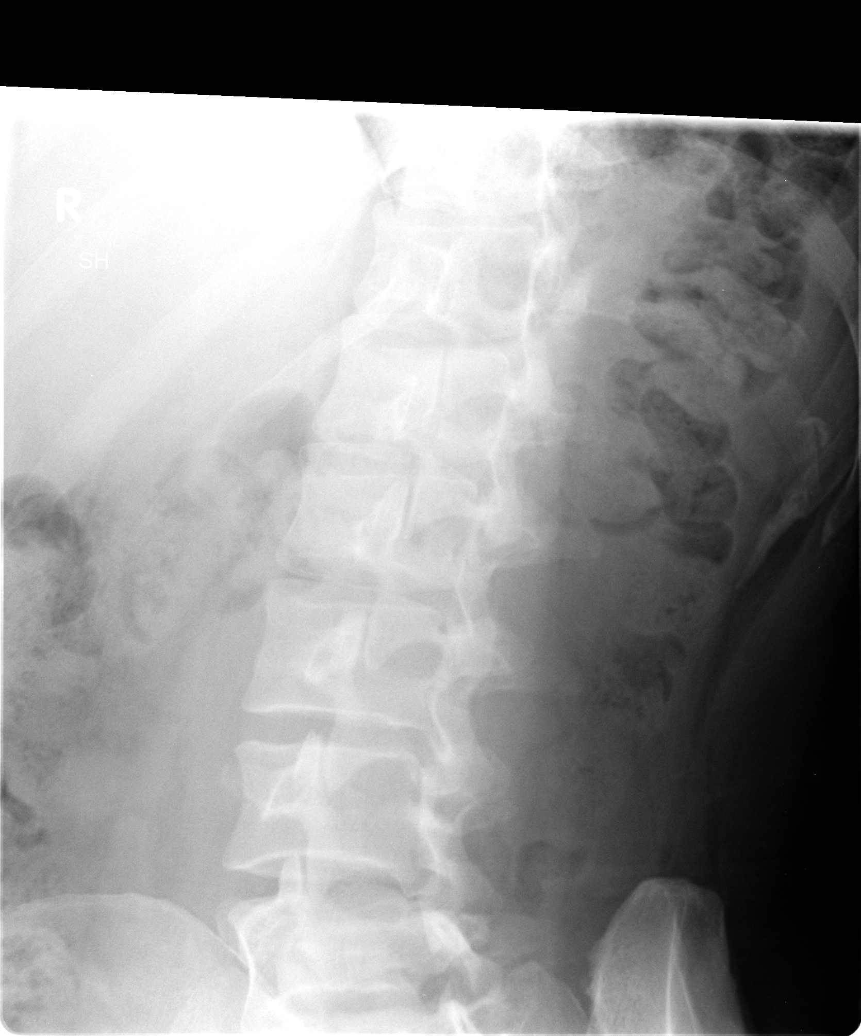

[view not recorded (3 of 6)]
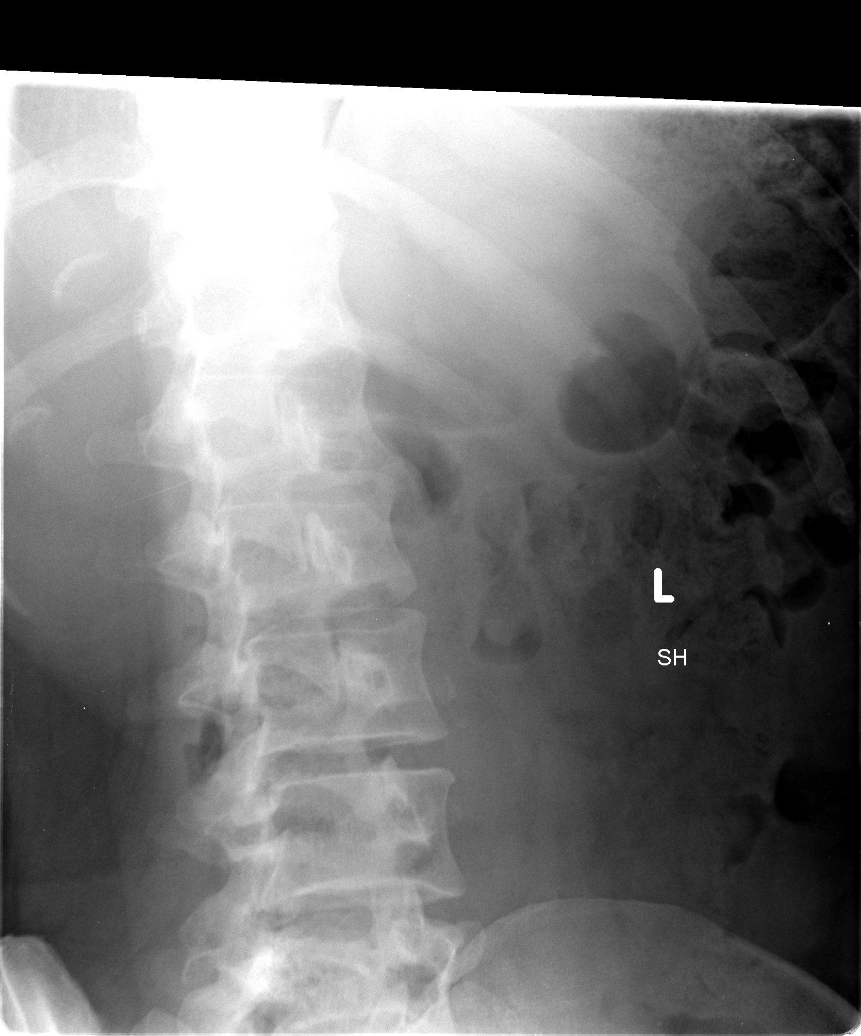

[view not recorded (4 of 6)]
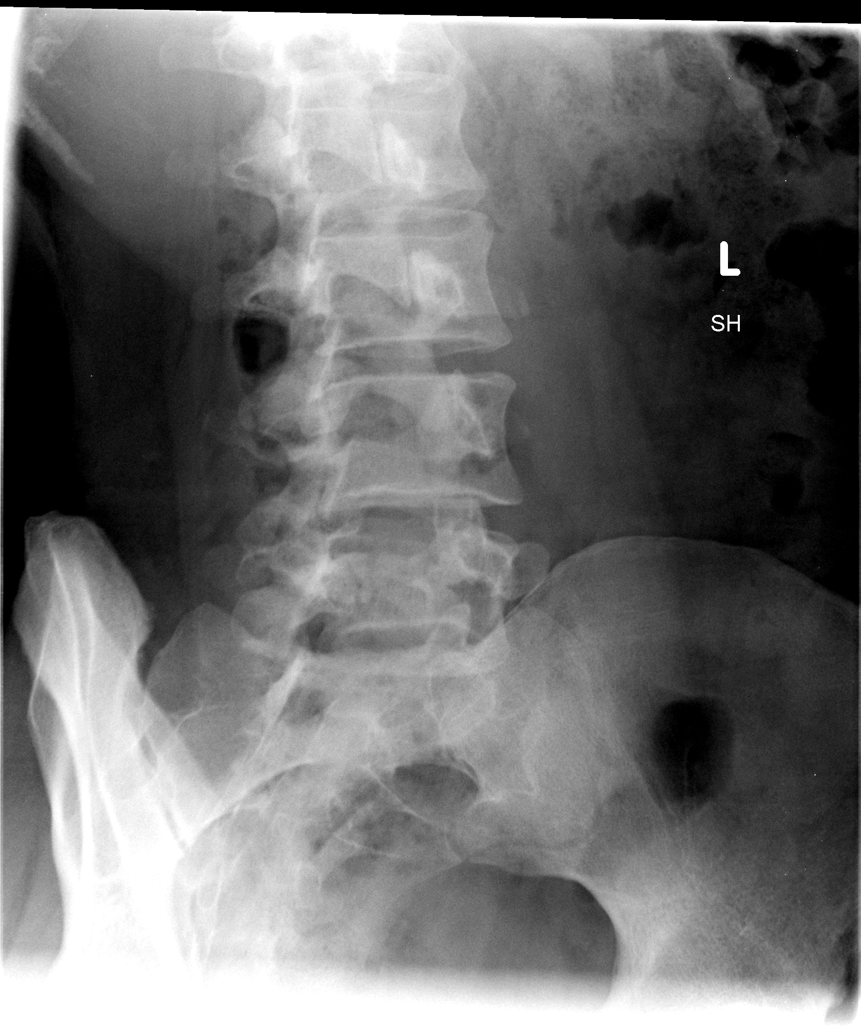

[view not recorded (5 of 6)]
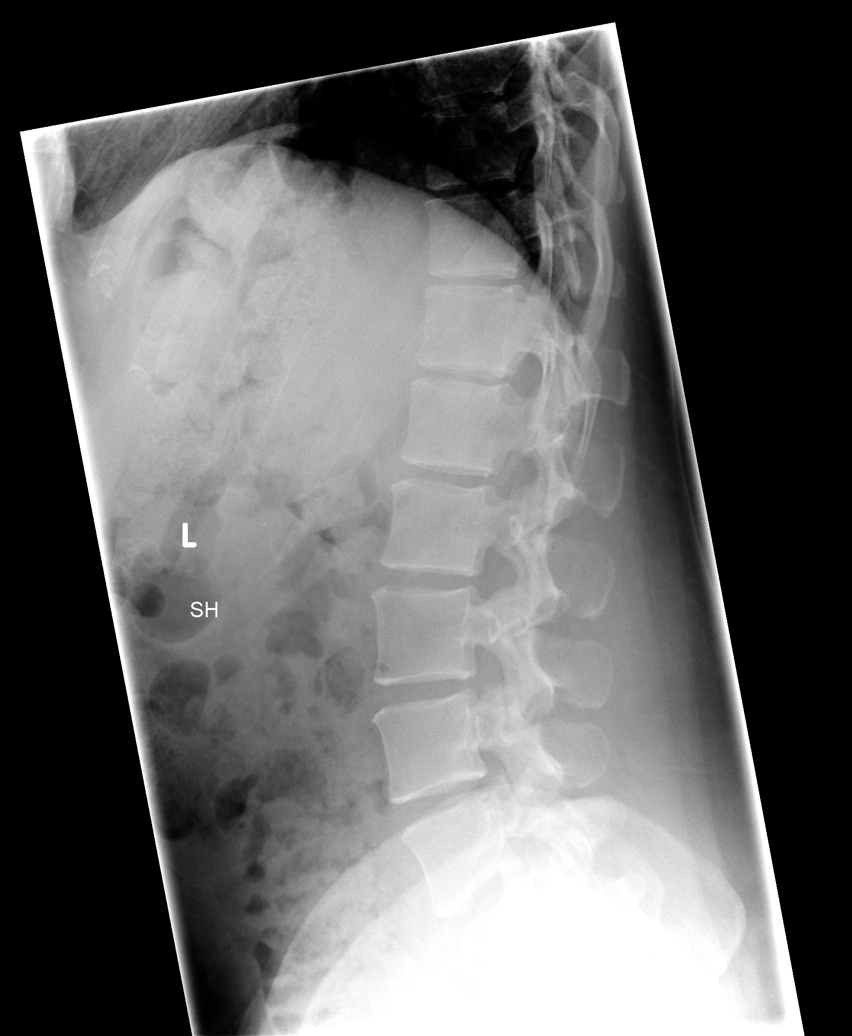

[view not recorded (6 of 6)]
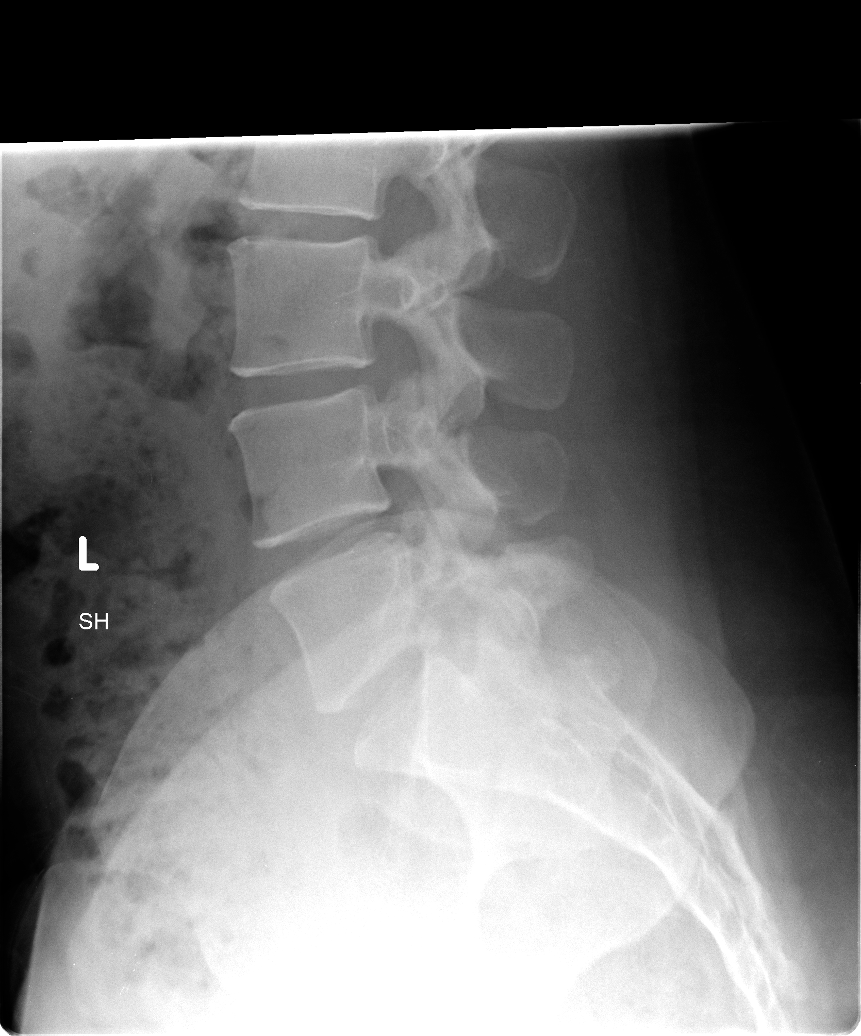

[6 of 6 positions shown; findings below may reference images not displayed]

FINDINGS: Six views of lumbar spine submitted. No acute fracture or
subluxation. Minimal anterior spurring upper endplate of L4
vertebral body. Alignment, disc spaces and vertebral body heights
are preserved. Mild facet degenerative changes L5 level.
IMPRESSION: No acute fracture or subluxation.  Mild degenerative changes.

## 2016-04-17 ENCOUNTER — Telehealth: Payer: Self-pay | Admitting: *Deleted

## 2016-04-17 DIAGNOSIS — F331 Major depressive disorder, recurrent, moderate: Secondary | ICD-10-CM

## 2016-04-17 DIAGNOSIS — F411 Generalized anxiety disorder: Secondary | ICD-10-CM

## 2016-04-17 NOTE — Telephone Encounter (Signed)
Referral placed.

## 2016-04-25 ENCOUNTER — Ambulatory Visit (INDEPENDENT_AMBULATORY_CARE_PROVIDER_SITE_OTHER): Payer: BC Managed Care – PPO | Admitting: Physician Assistant

## 2016-04-25 ENCOUNTER — Encounter: Payer: Self-pay | Admitting: Physician Assistant

## 2016-04-25 VITALS — BP 122/63 | HR 81 | Ht 67.0 in | Wt 156.0 lb

## 2016-04-25 DIAGNOSIS — F411 Generalized anxiety disorder: Secondary | ICD-10-CM

## 2016-04-25 DIAGNOSIS — Z3A09 9 weeks gestation of pregnancy: Secondary | ICD-10-CM

## 2016-04-25 DIAGNOSIS — F331 Major depressive disorder, recurrent, moderate: Secondary | ICD-10-CM

## 2016-04-25 MED ORDER — SERTRALINE HCL 100 MG PO TABS: ORAL_TABLET | ORAL | 1 refills | Status: DC

## 2016-04-25 NOTE — Progress Notes (Addendum)
   Subjective:    Patient ID: Laurie Velasquez, female    DOB: 1980-09-16, 35 y.o.   MRN: [id number]  HPI Patient is a 35 yo who is nine weeks pregnant and a history of recurrent depression. She comes to the clinic today complaining of worsening depression for about 4 weeks. She reports that she does not feel like getting out of the bed because she feels hopeless and fatigued. Patient went to her OB November 6 of this year and he advised her to see her primary care provider and he told her to increase the dosage of the Zoloft to 75mg . Patient reports that she has been increasing the dose to 75mg  for two weeks with no improvement. Patient feels like the depression has gotten worse within the last two weeks. Patient reports that there has not been a certain event that has occurred to worsen her depression. Patient states that she has a good support system. Patient states that she did not feel this with her first pregnancy. Patient reports that she had a miscarriage last year. Patient denies suicidal or homicidal ideation.    Review of Systems See HPI     Objective:   Physical Exam  Constitutional: She appears well-developed and well-nourished.  Patient tearful during exam.   Cardiovascular: Normal rate and regular rhythm.  Exam reveals no gallop and no friction rub.   No murmur heard. Pulmonary/Chest: Effort normal and breath sounds normal. No respiratory distress. She has no wheezes. She has no rales.  Psychiatric:  Flat affect. Patient tearful during exam.           Assessment & Plan:  Diagnoses and all orders for this visit:  1. Moderate episode of recurrent major depressive disorder (HCC)       -PHQ-9 score 23 out of 28 -    Increase Zoloft to 100 mg then increase to one and a half pills (150 mg) if no improvement. If patient's depression persists, will add Wellbutrin after [redacted]wks gestation after the fetus' heart has fully developed due to the possibility of Wellbutrin causing congenital  heart defects. Follow up in 4 weeks.  -discussed what to do with suicidal or homicidal thoughts.   2. [redacted] weeks gestation of pregnancy -Patient's next appointment with OB in three weeks for 12 week check-up.   3. Generalized anxiety Disorder   -GAD-7 score 15 out of 21- severe anxiety  - Zoloft should also help with anxiety.

## 2016-08-23 ENCOUNTER — Other Ambulatory Visit: Payer: Self-pay | Admitting: Physician Assistant

## 2016-08-23 DIAGNOSIS — F331 Major depressive disorder, recurrent, moderate: Secondary | ICD-10-CM

## 2016-08-24 NOTE — Telephone Encounter (Signed)
I called the patient and discussed this medication. She has discussed drug safety with this medication with her OB/GYN. She is doing great on it. Plan to continue Zoloft. Follow-up as needed or after pregnancy.

## 2016-12-29 LAB — HM PAP SMEAR: HM Pap smear: NEGATIVE

## 2017-03-18 ENCOUNTER — Other Ambulatory Visit: Payer: Self-pay | Admitting: Family Medicine

## 2017-03-18 DIAGNOSIS — F331 Major depressive disorder, recurrent, moderate: Secondary | ICD-10-CM

## 2017-04-19 ENCOUNTER — Other Ambulatory Visit: Payer: Self-pay | Admitting: Physician Assistant

## 2017-04-19 DIAGNOSIS — F331 Major depressive disorder, recurrent, moderate: Secondary | ICD-10-CM

## 2017-04-23 ENCOUNTER — Telehealth: Payer: Self-pay | Admitting: Physician Assistant

## 2017-04-23 DIAGNOSIS — F331 Major depressive disorder, recurrent, moderate: Secondary | ICD-10-CM

## 2017-04-23 MED ORDER — SERTRALINE HCL 100 MG PO TABS: ORAL_TABLET | ORAL | 0 refills | Status: DC

## 2017-04-23 NOTE — Telephone Encounter (Signed)
Pt called.  She is needing a refill on sertraline.  Appointment made for 11/29.

## 2017-04-23 NOTE — Telephone Encounter (Signed)
Refill sent. Left VM to advise Pt.

## 2017-04-26 ENCOUNTER — Encounter: Payer: Self-pay | Admitting: Physician Assistant

## 2017-04-26 ENCOUNTER — Ambulatory Visit: Payer: BC Managed Care – PPO | Admitting: Physician Assistant

## 2017-04-26 VITALS — BP 114/63 | HR 69 | Ht 67.0 in | Wt 171.0 lb

## 2017-04-26 DIAGNOSIS — Z23 Encounter for immunization: Secondary | ICD-10-CM | POA: Diagnosis not present

## 2017-04-26 DIAGNOSIS — F410 Panic disorder [episodic paroxysmal anxiety] without agoraphobia: Secondary | ICD-10-CM | POA: Diagnosis not present

## 2017-04-26 DIAGNOSIS — Z79899 Other long term (current) drug therapy: Secondary | ICD-10-CM | POA: Diagnosis not present

## 2017-04-26 DIAGNOSIS — F331 Major depressive disorder, recurrent, moderate: Secondary | ICD-10-CM

## 2017-04-26 DIAGNOSIS — F411 Generalized anxiety disorder: Secondary | ICD-10-CM

## 2017-04-26 MED ORDER — SERTRALINE HCL 100 MG PO TABS: ORAL_TABLET | ORAL | 3 refills | Status: DC

## 2017-04-26 NOTE — Progress Notes (Addendum)
Subjective:    Patient ID: Laurie FeltsErin Velasquez, female    DOB: 1980/06/29, 36 y.o.   MRN: [id number]  HPI  [name] is a 36 y/o female who presents today for refill of her sertraline. She states she is doing well, however over the past 2 months she has been experiencing increased stress from going back to work after maternity leave, she is in the process of selling her house, and her grandmother had a stroke last week. She reports being fatigued from all the stress but denies any symptoms of panic attack. She is still breast feeding and has not gotten her period. She feels like her anxiety and depression is well controlled on the Sertraline because when she forgets to take a pill she notices immediately. She does not experience any side effects of the medication. She states she thinks she has more seasonal affective disorder because her symptoms are worse during the winter months. Last pap smear [facility].  .. Active Ambulatory Problems    Diagnosis Date Noted  . Sacroiliac joint dysfunction of both sides 06/11/2014  . GAD (generalized anxiety disorder) 06/17/2014  . Facet hypertrophy of lumbar region 08/28/2014  . Panic attacks 07/06/2015  . Depression 07/06/2015  . Nausea without vomiting 07/06/2015   Resolved Ambulatory Problems    Diagnosis Date Noted  . Back pain 06/15/2014  . Pregnancy 07/06/2015   No Additional Past Medical History      Review of Systems  Constitutional: Positive for fatigue.  Psychiatric/Behavioral: Negative for dysphoric mood. The patient is not nervous/anxious.        Objective:   Physical Exam  Constitutional: She is oriented to person, place, and time. She appears well-developed and well-nourished.  HENT:  Head: Normocephalic and atraumatic.  Cardiovascular: Normal rate, regular rhythm and normal heart sounds.  Pulmonary/Chest: Effort normal and breath sounds normal.  Neurological: She is alert and oriented to person, place, and time.   Psychiatric: She has a normal mood and affect. Her behavior is normal. Thought content normal.  Vitals reviewed.  .. Vitals:   04/26/17 1311  BP: 114/63  Pulse: 69      Assessment & Plan:  .Marland Kitchen.Marland Kitchen.Denny Peonrin was seen today for depression and anxiety.  Diagnoses and all orders for this visit:  Influenza vaccine needed -     Flu Vaccine QUAD 6+ mos PF IM (Fluarix Quad PF)  GAD (generalized anxiety disorder)  Moderate episode of recurrent major depressive disorder (HCC) -     sertraline (ZOLOFT) 100 MG tablet; Take one tablet by mouth daily. Please keep appointment.  Panic attacks  Medication management -     COMPLETE METABOLIC PANEL WITH GFR  .Marland Kitchen.   GAD and major depressive disorder - Patient is doing well on sertraline and will continue to taking 100 mg daily. Encouraged healthy coping methods such as exercise and meditation during stressful times. Advised patient to follow-up or call back if her fatigue continues and she can have lab work done to assess Vitamin D levels, B12, CBC, and TSH to rule out other causes of fatigue. -refilled for one year.   Medication management - CMP w/ GFR to assess kidney function. ... [facility] 2/9 04/26/2017 04/25/2016 03/03/2016  Decreased Interest 1 3 1   Down, Depressed, Hopeless 0 3 1  PHQ - 2 Score 1 6 2   Altered sleeping 0 3 0  Tired, decreased energy 2 3 1   Change in appetite 1 3 0  Feeling bad or failure  about yourself  0 2 0  Trouble concentrating 0 3 1  Moving slowly or fidgety/restless 1 3 0  Suicidal thoughts 0 0 0  PHQ-9 Score 5 23 4   Difficult doing work/chores Not difficult at all Extremely dIfficult -   .. GAD 7 : Generalized Anxiety Score 04/26/2017 04/25/2016 03/03/2016  Nervous, Anxious, on Edge 2 3 1   Control/stop worrying 2 2 1   Worry too much - different things 1 2 1   Trouble relaxing 1 3 1   Restless 0 0 0  Easily annoyed or irritable 2 3 0  Afraid - awful might happen 0 2 0  Total GAD 7 Score 8 15 4    Anxiety Difficulty - Extremely difficult Somewhat difficult

## 2017-04-26 NOTE — Progress Notes (Deleted)
   Subjective:    Patient ID: Laurie FeltsErin Velasquez, female    DOB: 07-08-1980, 36 y.o.   MRN: [id number]  HPI  [facility]  Review of Systems     Objective:   Physical Exam        Assessment & Plan:

## 2017-05-24 ENCOUNTER — Other Ambulatory Visit: Payer: Self-pay | Admitting: Physician Assistant

## 2017-05-24 DIAGNOSIS — F331 Major depressive disorder, recurrent, moderate: Secondary | ICD-10-CM

## 2017-08-13 ENCOUNTER — Ambulatory Visit: Payer: BC Managed Care – PPO | Admitting: Sports Medicine

## 2017-08-13 ENCOUNTER — Encounter: Payer: Self-pay | Admitting: Sports Medicine

## 2017-08-13 DIAGNOSIS — R69 Illness, unspecified: Secondary | ICD-10-CM

## 2017-08-13 DIAGNOSIS — J111 Influenza due to unidentified influenza virus with other respiratory manifestations: Secondary | ICD-10-CM | POA: Insufficient documentation

## 2017-08-13 MED ORDER — OSELTAMIVIR PHOSPHATE 75 MG PO CAPS: 75.0000 mg | ORAL_CAPSULE | Freq: Two times a day (BID) | ORAL | 0 refills | Status: DC

## 2017-08-13 MED ORDER — THERAFLU SEVERE COLD/CGH DAY 20-10-650 MG PO PACK: PACK | ORAL | 0 refills | Status: DC

## 2017-08-13 NOTE — Progress Notes (Signed)
  Subjective:    CC: Feeling sick  HPI: For the past day this pleasant 37 year old female has had fevers, chills, muscle aches, body aches, sore throat, runny nose, no GI symptoms, symptoms are moderate, persistent.  I reviewed the past medical history, family history, social history, surgical history, and allergies today and no changes were needed.  Please see the problem list section below in epic for further details.  Past Medical History: No past medical history on file. Past Surgical History: No past surgical history on file. Social History: Social History   Socioeconomic History  . Marital status: Married    Spouse name: None  . Number of children: None  . Years of education: None  . Highest education level: None  Social Needs  . Financial resource strain: None  . Food insecurity - worry: None  . Food insecurity - inability: None  . Transportation needs - medical: None  . Transportation needs - non-medical: None  Occupational History  . None  Tobacco Use  . Smoking status: Former Smoker    Last attempt to quit: 05/30/2007    Years since quitting: 10.2  . Smokeless tobacco: Never Used  Substance and Sexual Activity  . Alcohol use: Yes    Alcohol/week: 0.0 oz    Comment: 2 drinks a week  . Drug use: None  . Sexual activity: None  Other Topics Concern  . None  Social History Narrative  . None   Family History: No family history on file. Allergies: Allergies  Allergen Reactions  . Penicillins     "allergic since was little"    Medications: See med rec.  Review of Systems: No fevers, chills, night sweats, weight loss, chest pain, or shortness of breath.   Objective:    General: Well Developed, well nourished, and in no acute distress.  Neuro: Alert and oriented x3, extra-ocular muscles intact, sensation grossly intact.  HEENT: Normocephalic, atraumatic, pupils equal round reactive to light, neck supple, no masses, no lymphadenopathy, thyroid nonpalpable.   Oropharynx, nasopharynx, ear canals unremarkable. Skin: Warm and dry, no rashes. Cardiac: Regular rate and rhythm, no murmurs rubs or gallops, no lower extremity edema.  Respiratory: Clear to auscultation bilaterally. Not using accessory muscles, speaking in full sentences.  Rapid flu test is negative  Impression and Recommendations:    Influenza-like illness Tamiflu, TheraFlu, rest and hydration.  I spent 25 minutes with this patient, greater than 50% was face-to-face time counseling regarding the above diagnoses ___________________________________________ Ihor Austinhomas J. Benjamin Stainhekkekandam, M.D., ABFM., CAQSM. Primary Care and Sports Medicine [facility]  Adjunct Instructor of Family Medicine  [employer]

## 2017-08-13 NOTE — Assessment & Plan Note (Signed)
Tamiflu, TheraFlu, rest and hydration.

## 2017-11-20 ENCOUNTER — Other Ambulatory Visit: Payer: Self-pay | Admitting: Physician Assistant

## 2017-11-20 DIAGNOSIS — F331 Major depressive disorder, recurrent, moderate: Secondary | ICD-10-CM

## 2017-11-20 MED ORDER — SERTRALINE HCL 100 MG PO TABS: ORAL_TABLET | ORAL | 0 refills | Status: DC

## 2017-11-20 NOTE — Telephone Encounter (Signed)
Patient calls and [place] and she forgot her Zoloft at home. Wants to know if you can send a 4 day supply to pharmacy there. I advised pt to get name and phone number of pharmacy and we should be able to fax rx to them. KG LPN

## 2017-11-20 NOTE — Telephone Encounter (Signed)
Yes ok [place]

## 2017-11-20 NOTE — Telephone Encounter (Signed)
Called [facility] [place] [address] [place]  Faxed to [phone]

## 2018-03-25 ENCOUNTER — Other Ambulatory Visit: Payer: Self-pay | Admitting: Physician Assistant

## 2018-03-25 DIAGNOSIS — F331 Major depressive disorder, recurrent, moderate: Secondary | ICD-10-CM

## 2018-04-01 ENCOUNTER — Encounter: Payer: Self-pay | Admitting: Physician Assistant

## 2018-04-01 ENCOUNTER — Ambulatory Visit: Payer: BC Managed Care – PPO | Admitting: Physician Assistant

## 2018-04-01 VITALS — BP 125/76 | HR 77 | Ht 67.0 in | Wt 163.0 lb

## 2018-04-01 DIAGNOSIS — R42 Dizziness and giddiness: Secondary | ICD-10-CM

## 2018-04-01 DIAGNOSIS — F331 Major depressive disorder, recurrent, moderate: Secondary | ICD-10-CM

## 2018-04-01 DIAGNOSIS — J320 Chronic maxillary sinusitis: Secondary | ICD-10-CM | POA: Diagnosis not present

## 2018-04-01 MED ORDER — FLUTICASONE PROPIONATE 50 MCG/ACT NA SUSP: 2.0000 | Freq: Every day | NASAL | 6 refills | Status: DC

## 2018-04-01 MED ORDER — METHYLPREDNISOLONE 4 MG PO TBPK: ORAL_TABLET | ORAL | 0 refills | Status: DC

## 2018-04-01 MED ORDER — MECLIZINE HCL 25 MG PO TABS: 25.0000 mg | ORAL_TABLET | Freq: Three times a day (TID) | ORAL | 0 refills | Status: DC | PRN

## 2018-04-01 MED ORDER — SERTRALINE HCL 100 MG PO TABS: ORAL_TABLET | ORAL | 3 refills | Status: DC

## 2018-04-01 MED ORDER — AZITHROMYCIN 250 MG PO TABS: ORAL_TABLET | ORAL | 0 refills | Status: DC

## 2018-04-01 NOTE — Patient Instructions (Signed)

## 2018-04-02 ENCOUNTER — Encounter: Payer: Self-pay | Admitting: Physician Assistant

## 2018-04-02 NOTE — Progress Notes (Signed)
Subjective:    Patient ID: Laurie Velasquez, female    DOB: 04-06-1981, 37 y.o.   MRN: [id number]  HPI Pt is a 37 yo female for the last 2 weeks has battled upper respiratory symptoms. She has had nasal congestion, ear popping, headaches, ST, cough. No SOB, wheezing. She has taking some flonase but ran out. She has used sudafed OTC. She continues to have right sided sinus pressure and "full sensation". She is most concerned with the dizziness. If she bends over she gets dizzy and a headache. She has had vertigo before but got better when she laid down. She continues to feel "swimmy" when she lays down. No worrisome nausea or vomiting.   She is doing great on zoloft. She wishes to keep the same. No problems or concerns. Mood is well controlled. Her son is almost 2 and would like to stay on it for a while longer.   .. Active Ambulatory Problems    Diagnosis Date Noted  . Sacroiliac joint dysfunction of both sides 06/11/2014  . GAD (generalized anxiety disorder) 06/17/2014  . Facet hypertrophy of lumbar region 08/28/2014  . Panic attacks 07/06/2015  . Depression 07/06/2015  . Nausea without vomiting 07/06/2015  . Influenza-like illness 08/13/2017   Resolved Ambulatory Problems    Diagnosis Date Noted  . Back pain 06/15/2014  . Pregnancy 07/06/2015   No Additional Past Medical History      Review of Systems See HPI.     Objective:   Physical Exam  Constitutional: She is oriented to person, place, and time. She appears well-developed and well-nourished.  HENT:  Head: Normocephalic and atraumatic.  Right Ear: External ear normal.  Left Ear: External ear normal.  Mouth/Throat: Oropharynx is clear and moist.  Right TM mild effusion from 5 to 6 oclock.  Left TM clear.  Right maxillary sinuses tenderness.  Bilateral nasal turbinates red and swollen.  Oropharynx erythematous with no tonsilar swelling or exudate.   Eyes: Conjunctivae are normal.  Cardiovascular: Normal rate and regular  rhythm.  Pulmonary/Chest: Effort normal and breath sounds normal. She has no wheezes.  Lymphadenopathy:    She has cervical adenopathy.  Neurological: She is alert and oriented to person, place, and time. No cranial nerve deficit. Coordination normal.  dixhallpike negative for nystagmus bilaterally. She was more dizzy after sitting up.   Skin: No rash noted.  Psychiatric: She has a normal mood and affect.          Assessment & Plan:  Marland KitchenMarland KitchenDiagnoses and all orders for this visit:  Right maxillary sinusitis -     azithromycin (ZITHROMAX) 250 MG tablet; Take 2 tablets now and the one tablet for 4 days. -     methylPREDNISolone (MEDROL DOSEPAK) 4 MG TBPK tablet; Take as directed by package insert. -     fluticasone (FLONASE) 50 MCG/ACT nasal spray; Place 2 sprays into both nostrils daily.  Moderate episode of recurrent major depressive disorder (HCC) -     sertraline (ZOLOFT) 100 MG tablet; TAKE 1 TABLET BY MOUTH DAILY.  Vertigo -     azithromycin (ZITHROMAX) 250 MG tablet; Take 2 tablets now and the one tablet for 4 days. -     methylPREDNISolone (MEDROL DOSEPAK) 4 MG TBPK tablet; Take as directed by package insert. -     meclizine (ANTIVERT) 25 MG tablet; Take 1 tablet (25 mg total) by mouth 3 (three) times daily as needed for dizziness.   .. Depression screen [facility] 2/9 04/02/2018 04/26/2017 04/25/2016 03/03/2016  Decreased Interest 0 1 3 1   Down, Depressed, Hopeless 1 0 3 1  PHQ - 2 Score 1 1 6 2   Altered sleeping 1 0 3 0  Tired, decreased energy 1 2 3 1   Change in appetite 0 1 3 0  Feeling bad or failure about yourself  0 0 2 0  Trouble concentrating 0 0 3 1  Moving slowly or fidgety/restless - 1 3 0  Suicidal thoughts 0 0 0 0  PHQ-9 Score 3 5 23 4   Difficult doing work/chores Not difficult at all Not difficult at all Extremely dIfficult -   Pt is doing great on zoloft will refill for 1 year. She does want to continue while her son is small. In next year she may consider  tapering down.   I suspect right sided sinus infection with some ETD causing vertigo. zpak and medrol dose given. Stay hydrated. HO given. antivert for dizziness. Discussed could make her a little sleepier. Continue flonase. Follow up as needed.

## 2018-06-19 ENCOUNTER — Emergency Department
Admission: EM | Admit: 2018-06-19 | Discharge: 2018-06-19 | Disposition: A | Discharge status: Home / Self Care | Payer: BC Managed Care – PPO | Source: Home / Self Care | Attending: Family Medicine | Admitting: Family Medicine

## 2018-06-19 ENCOUNTER — Encounter: Payer: Self-pay | Admitting: Emergency Medicine

## 2018-06-19 DIAGNOSIS — B349 Viral infection, unspecified: Secondary | ICD-10-CM

## 2018-06-19 LAB — POCT RAPID STREP A (OFFICE): Rapid Strep A Screen: NEGATIVE

## 2018-06-19 LAB — POCT INFLUENZA A/B
Influenza A, POC: NEGATIVE
Influenza B, POC: NEGATIVE

## 2018-06-19 NOTE — ED Triage Notes (Signed)
Pt c/o cough, body aches and fever that started Monday. States tmax 101 last night. Taking nyquil and wants flu and strep test because her daughter tested positive for strep over weekend.

## 2018-06-19 NOTE — Discharge Instructions (Signed)
  You may take 500mg acetaminophen every 4-6 hours or in combination with ibuprofen 400-600mg every 6-8 hours as needed for pain, inflammation, and fever.  Be sure to well hydrated with clear liquids and get at least 8 hours of sleep at night, preferably more while sick.   Please follow up with family medicine in 1 week if needed.   

## 2018-06-19 NOTE — ED Provider Notes (Signed)
[facility]    CSN: [id number] Arrival date & time: 06/19/18  0850     History   Chief Complaint Chief Complaint  Patient presents with  . Fever    HPI Laurie Velasquez is a 38 y.o. female.   HPI Laurie Velasquez is a 38 y.o. female presenting to UC with c/o 2 days of cough, congestion, body aches and fever. Fever Tmax last night 101*F. She has taken Nyquil and Tylenol sinus with mild relief. Pt requesting to be tested for flu and strep, her daughter tested positive for strep over the weekend. Pt did not receive flu vaccine this season.    History reviewed. No pertinent past medical history.  Patient Active Problem List   Diagnosis Date Noted  . Influenza-like illness 08/13/2017  . Panic attacks 07/06/2015  . Depression 07/06/2015  . Nausea without vomiting 07/06/2015  . Facet hypertrophy of lumbar region 08/28/2014  . GAD (generalized anxiety disorder) 06/17/2014  . Sacroiliac joint dysfunction of both sides 06/11/2014    History reviewed. No pertinent surgical history.  OB History    Gravida  1   Para      Term      Preterm      AB      Living        SAB      TAB      Ectopic      Multiple      Live Births               Home Medications    Prior to Admission medications   Medication Sig Start Date End Date Taking? Authorizing Provider  azithromycin (ZITHROMAX) 250 MG tablet Take 2 tablets now and the one tablet for 4 days. 04/01/18   Breeback, Jade L, PA-C  fluticasone (FLONASE) 50 MCG/ACT nasal spray Place 2 sprays into both nostrils daily. 04/01/18   Breeback, Lonna CobbJade L, PA-C  meclizine (ANTIVERT) 25 MG tablet Take 1 tablet (25 mg total) by mouth 3 (three) times daily as needed for dizziness. 04/01/18   Breeback, Jade L, PA-C  methylPREDNISolone (MEDROL DOSEPAK) 4 MG TBPK tablet Take as directed by package insert. 04/01/18   Breeback, Jade L, PA-C  sertraline (ZOLOFT) 100 MG tablet TAKE 1 TABLET BY MOUTH DAILY. 04/01/18   Jomarie LongsBreeback, Jade L, PA-C      Family History History reviewed. No pertinent family history.  Social History Social History   Tobacco Use  . Smoking status: Former Smoker    Types: Cigarettes    Last attempt to quit: 05/30/2007    Years since quitting: 11.0  . Smokeless tobacco: Never Used  Substance Use Topics  . Alcohol use: Yes    Alcohol/week: 0.0 standard drinks    Comment: 2 drinks a week  . Drug use: Not on file     Allergies   Penicillins   Review of Systems Review of Systems  Constitutional: Positive for chills, fatigue and fever.  HENT: Positive for congestion and postnasal drip. Negative for ear pain, sore throat, trouble swallowing and voice change.   Respiratory: Positive for cough. Negative for shortness of breath.   Cardiovascular: Negative for chest pain and palpitations.  Gastrointestinal: Negative for abdominal pain, diarrhea, nausea and vomiting.  Musculoskeletal: Positive for arthralgias, back pain and myalgias.  Skin: Negative for rash.     Physical Exam Triage Vital Signs ED Triage Vitals  Enc Vitals Group     BP 06/19/18 0916 127/85     Pulse  Rate 06/19/18 0916 99     Resp --      Temp 06/19/18 0916 98.4 F (36.9 C)     Temp Source 06/19/18 0916 Oral     SpO2 06/19/18 0916 98 %     Weight 06/19/18 0917 166 lb (75.3 kg)     Height --      Head Circumference --      Peak Flow --      Pain Score 06/19/18 0917 5     Pain Loc --      Pain Edu? --      Excl. in GC? --    No data found.  Updated Vital Signs BP 127/85 (BP Location: Right Arm)   Pulse 99   Temp 98.4 F (36.9 C) (Oral)   Wt 166 lb (75.3 kg)   LMP 06/11/2018   SpO2 98%   Breastfeeding No   BMI 26.00 kg/m   Visual Acuity Right Eye Distance:   Left Eye Distance:   Bilateral Distance:    Right Eye Near:   Left Eye Near:    Bilateral Near:     Physical Exam Vitals signs and nursing note reviewed.  Constitutional:      Appearance: Normal appearance. She is well-developed.  HENT:      Head: Normocephalic and atraumatic.     Right Ear: Tympanic membrane normal.     Left Ear: Tympanic membrane normal.     Nose: Nose normal.     Right Sinus: No maxillary sinus tenderness or frontal sinus tenderness.     Left Sinus: No maxillary sinus tenderness or frontal sinus tenderness.     Mouth/Throat:     Lips: Pink.     Mouth: Mucous membranes are moist.     Pharynx: Oropharynx is clear. Uvula midline. No pharyngeal swelling, oropharyngeal exudate, posterior oropharyngeal erythema or uvula swelling.  Neck:     Musculoskeletal: Normal range of motion and neck supple.  Cardiovascular:     Rate and Rhythm: Normal rate and regular rhythm.  Pulmonary:     Effort: Pulmonary effort is normal. No respiratory distress.     Breath sounds: Normal breath sounds. No stridor. No wheezing or rhonchi.  Musculoskeletal: Normal range of motion.  Skin:    General: Skin is warm and dry.  Neurological:     Mental Status: She is alert and oriented to person, place, and time.  Psychiatric:        Behavior: Behavior normal.      UC Treatments / Results  Labs (all labs ordered are listed, but only abnormal results are displayed) Labs Reviewed  POCT INFLUENZA A/B  POCT RAPID STREP A (OFFICE)    EKG None  Radiology No results found.  Procedures Procedures (including critical care time)  Medications Ordered in UC Medications - No data to display  Initial Impression / Assessment and Plan / UC Course  I have reviewed the triage vital signs and the nursing notes.  Pertinent labs & imaging results that were available during my care of the patient were reviewed by me and considered in my medical decision making (see chart for details).     Rapid flu and strep: NEGATIVE Reassured pt Encouraged symptomatic tx for viral illness Encouraged f/u with PCP as needed.  Final Clinical Impressions(s) / UC Diagnoses   Final diagnoses:  Viral illness     Discharge Instructions      You  may take 500mg  acetaminophen every 4-6 hours or in combination with  ibuprofen 400-600mg  every 6-8 hours as needed for pain, inflammation, and fever.  Be sure to well hydrated with clear liquids and get at least 8 hours of sleep at night, preferably more while sick.   Please follow up with family medicine in 1 week if needed.     ED Prescriptions    None     Controlled Substance Prescriptions [place] Controlled Substance Registry consulted? Not Applicable   Rolla Platehelps, Sairah O, PA-C 06/19/18 1043

## 2018-07-04 ENCOUNTER — Encounter: Payer: Self-pay | Admitting: Sports Medicine

## 2018-07-04 ENCOUNTER — Ambulatory Visit: Payer: BC Managed Care – PPO | Admitting: Sports Medicine

## 2018-07-04 DIAGNOSIS — M533 Sacrococcygeal disorders, not elsewhere classified: Secondary | ICD-10-CM | POA: Diagnosis not present

## 2018-07-04 MED ORDER — AMBULATORY NON FORMULARY MEDICATION: 0 refills | Status: DC

## 2018-07-04 MED ORDER — CELECOXIB 200 MG PO CAPS: ORAL_CAPSULE | ORAL | 2 refills | Status: DC

## 2018-07-04 NOTE — Assessment & Plan Note (Signed)
Did well after left-sided sacroiliac joint injection under ultrasound guidance back in 2016. Having recurrence of pain, we are going to start conservatively, meloxicam is no longer efficacious, switching to Celebrex. Rehab exercises. She does desire a home TENS unit. Return to see me in a month, SI joint injections if no better.

## 2018-07-04 NOTE — Progress Notes (Signed)
Subjective:    CC: Low back pain  HPI: This is a pleasant 38 year old female, she has a longstanding history of low back pain, I treated her about 4 years ago with a SI joint injection that provided good relief.  Since then she is had a baby and this has also set off her back pain, she localizes it in both sacroiliac joints with burning sensations, no radiation past the buttock or thighs.  No bowel or bladder dysfunction, saddle numbness, no constitutional symptoms, no trauma.  She is also requesting a new set of orthotics, I did discuss with her that we are unable to do these for now.  I reviewed the past medical history, family history, social history, surgical history, and allergies today and no changes were needed.  Please see the problem list section below in epic for further details.  Past Medical History: No past medical history on file. Past Surgical History: No past surgical history on file. Social History: Social History   Socioeconomic History  . Marital status: Married    Spouse name: Not on file  . Number of children: Not on file  . Years of education: Not on file  . Highest education level: Not on file  Occupational History  . Not on file  Social Needs  . Financial resource strain: Not on file  . Food insecurity:    Worry: Not on file    Inability: Not on file  . Transportation needs:    Medical: Not on file    Non-medical: Not on file  Tobacco Use  . Smoking status: Former Smoker    Types: Cigarettes    Last attempt to quit: 05/30/2007    Years since quitting: 11.1  . Smokeless tobacco: Never Used  Substance and Sexual Activity  . Alcohol use: Yes    Alcohol/week: 0.0 standard drinks    Comment: 2 drinks a week  . Drug use: Not on file  . Sexual activity: Not on file  Lifestyle  . Physical activity:    Days per week: Not on file    Minutes per session: Not on file  . Stress: Not on file  Relationships  . Social connections:    Talks on phone: Not on  file    Gets together: Not on file    Attends religious service: Not on file    Active member of club or organization: Not on file    Attends meetings of clubs or organizations: Not on file    Relationship status: Not on file  Other Topics Concern  . Not on file  Social History Narrative  . Not on file   Family History: No family history on file. Allergies: Allergies  Allergen Reactions  . Penicillins     "allergic since was little"    Medications: See med rec.  Review of Systems: No fevers, chills, night sweats, weight loss, chest pain, or shortness of breath.   Objective:    General: Well Developed, well nourished, and in no acute distress.  Neuro: Alert and oriented x3, extra-ocular muscles intact, sensation grossly intact.  HEENT: Normocephalic, atraumatic, pupils equal round reactive to light, neck supple, no masses, no lymphadenopathy, thyroid nonpalpable.  Skin: Warm and dry, no rashes. Cardiac: Regular rate and rhythm, no murmurs rubs or gallops, no lower extremity edema.  Respiratory: Clear to auscultation bilaterally. Not using accessory muscles, speaking in full sentences. Back Exam:  Inspection: Unremarkable  Motion: Flexion 45 deg, Extension 45 deg, Side Bending to 45 deg  bilaterally,  Rotation to 45 deg bilaterally  SLR laying: Negative  XSLR laying: Negative  Palpable tenderness: Bilateral sacroiliac joints. FABER: negative. Sensory change: Gross sensation intact to all lumbar and sacral dermatomes.  Reflexes: 2+ at both patellar tendons, 2+ at achilles tendons, Babinski's downgoing.  Strength at foot  Plantar-flexion: 5/5 Dorsi-flexion: 5/5 Eversion: 5/5 Inversion: 5/5  Leg strength  Quad: 5/5 Hamstring: 5/5 Hip flexor: 5/5 Hip abductors: 5/5  Gait unremarkable.  Impression and Recommendations:    Sacroiliac joint dysfunction of both sides Did well after left-sided sacroiliac joint injection under ultrasound guidance back in 2016. Having recurrence  of pain, we are going to start conservatively, meloxicam is no longer efficacious, switching to Celebrex. Rehab exercises. She does desire a home TENS unit. Return to see me in a month, SI joint injections if no better. ___________________________________________ Ihor Austin. Benjamin Stain, M.D., ABFM., CAQSM. Primary Care and Sports Medicine [facility]  Adjunct Professor of Family Medicine  [employer]

## 2018-08-19 ENCOUNTER — Encounter (INDEPENDENT_AMBULATORY_CARE_PROVIDER_SITE_OTHER): Payer: BC Managed Care – PPO | Admitting: Physician Assistant

## 2018-08-19 DIAGNOSIS — F419 Anxiety disorder, unspecified: Secondary | ICD-10-CM

## 2018-08-19 DIAGNOSIS — F339 Major depressive disorder, recurrent, unspecified: Secondary | ICD-10-CM

## 2018-08-20 MED ORDER — LORAZEPAM 0.5 MG PO TABS: 0.5000 mg | ORAL_TABLET | Freq: Three times a day (TID) | ORAL | 0 refills | Status: DC | PRN

## 2018-08-20 NOTE — Telephone Encounter (Signed)
Pt reports increasing anxiety, panic and depression. I think she would benefit from counseling with Shanda Bumps in office via tele medicine. Agreed to give small quantity of ativan to get help through some transition time with kids at home via the CoVid virus.   Marland KitchenMarland KitchenPDMP reviewed during this encounter. No concerns.

## 2018-08-24 ENCOUNTER — Encounter: Payer: Self-pay | Admitting: Physician Assistant

## 2018-11-27 ENCOUNTER — Encounter: Payer: Self-pay | Admitting: Physician Assistant

## 2019-01-15 ENCOUNTER — Encounter: Payer: Self-pay | Admitting: Physician Assistant

## 2019-01-15 ENCOUNTER — Ambulatory Visit (INDEPENDENT_AMBULATORY_CARE_PROVIDER_SITE_OTHER): Payer: BC Managed Care – PPO | Admitting: Physician Assistant

## 2019-01-15 ENCOUNTER — Other Ambulatory Visit: Payer: Self-pay

## 2019-01-15 VITALS — Ht 67.0 in | Wt 166.0 lb

## 2019-01-15 DIAGNOSIS — F331 Major depressive disorder, recurrent, moderate: Secondary | ICD-10-CM | POA: Diagnosis not present

## 2019-01-15 DIAGNOSIS — Z566 Other physical and mental strain related to work: Secondary | ICD-10-CM | POA: Diagnosis not present

## 2019-01-15 DIAGNOSIS — G479 Sleep disorder, unspecified: Secondary | ICD-10-CM

## 2019-01-15 DIAGNOSIS — F419 Anxiety disorder, unspecified: Secondary | ICD-10-CM

## 2019-01-15 MED ORDER — LORAZEPAM 0.5 MG PO TABS: 0.5000 mg | ORAL_TABLET | Freq: Three times a day (TID) | ORAL | 2 refills | Status: DC | PRN

## 2019-01-15 MED ORDER — SERTRALINE HCL 100 MG PO TABS: ORAL_TABLET | ORAL | 3 refills | Status: DC

## 2019-01-15 NOTE — Progress Notes (Signed)
She was given ativan in the spring, but has just taken her last tablet this week. Asking for refills. She is a Pharmacist, hospital and feeling very stressed right now. PHQ9-GAD7 completed.

## 2019-01-15 NOTE — Progress Notes (Signed)
Patient ID: Laurie Velasquez, female   DOB: 1980-10-24, 38 y.o.   MRN: [id number] .Virtual Visit via Video Note  I connected with Laurie Velasquez on 01/15/19 at 11:30 AM EDT by a video enabled telemedicine application and verified that I am speaking with the correct person using two identifiers.  Location: Patient: home  Provider: clinic   I discussed the limitations of evaluation and management by telemedicine and the availability of in person appointments. The patient expressed understanding and agreed to proceed.  History of Present Illness: Patient is a 38 year old female with a history of major depression and anxiety who presents to the clinic for medication refills.  She feels like she is doing okay with Zoloft.  She does not want to increase this medication because at higher doses it gives her side effects.  She does feel like her anxiety is worse.  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  She feels overwhelmed by all the programs and the crashing of programs that occurred this week.  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  She is also having some problems staying asleep.  She goes to sleep with melatonin but then wakes up in a panic a few hours later.  She denies any suicidal or homicidal thoughts.  She was given Ativan in the spring and has been using as needed and doing well.  This seems to help her tremendously.  She would like a refill.  .. Active Ambulatory Problems    Diagnosis Date Noted  . Sacroiliac joint dysfunction of both sides 06/11/2014  . GAD (generalized anxiety disorder) 06/17/2014  . Facet hypertrophy of lumbar region 08/28/2014  . Panic attacks 07/06/2015  . Depression 07/06/2015  . Nausea without vomiting 07/06/2015  . Influenza-like illness 08/13/2017  . Trouble in sleeping 01/15/2019  . Anxiety 01/15/2019  . Stress at work 01/15/2019   Resolved Ambulatory Problems    Diagnosis  Date Noted  . Back pain 06/15/2014  . Pregnancy 07/06/2015   No Additional Past Medical History   Reviewed med, allergy, problem list.     Observations/Objective: No acute distress.  No labored breathing.  Anxious mood.   .. Today's Vitals   01/15/19 1108  Weight: 166 lb (75.3 kg)  Height: 5\' 7"  (1.702 m)   Body mass index is 26 kg/m.  .. [facility] 01/15/2019 04/02/2018 04/26/2017 04/25/2016 03/03/2016  Decreased Interest 1 0 1 3 1   Down, Depressed, Hopeless 1 1 0 3 1  PHQ - 2 Score 2 1 1 6 2   Altered sleeping 3 1 0 3 0  Tired, decreased energy 3 1 2 3 1   Change in appetite 3 0 1 3 0  Feeling bad or failure about yourself  1 0 0 2 0  Trouble concentrating 3 0 0 3 1  Moving slowly or fidgety/restless 3 - 1 3 0  Suicidal thoughts 0 0 0 0 0  PHQ-9 Score 18 3 5 23 4   Difficult doing work/chores Very difficult Not difficult at all Not difficult at all Extremely dIfficult -   .. GAD 7 : Generalized Anxiety Score 01/15/2019 04/26/2017 04/25/2016 03/03/2016  Nervous, Anxious, on Edge 3 2 3 1   Control/stop worrying 3 2 2 1   Worry too much - different things 3 1 2 1   Trouble relaxing 3 1 3 1   Restless 3 0 0 0  Easily annoyed or irritable  3 2 3  0  Afraid - awful might happen 3 0 2 0  Total GAD 7 Score 21 8 15 4   Anxiety Difficulty Somewhat difficult - Extremely difficult Somewhat difficult       Assessment and Plan: Marland KitchenMarland KitchenChelbi was seen today for anxiety.  Diagnoses and all orders for this visit:  Stress at work -     LORazepam (ATIVAN) 0.5 MG tablet; Take 1 tablet (0.5 mg total) by mouth every 8 (eight) hours as needed for anxiety.  Moderate episode of recurrent major depressive disorder (HCC) -     sertraline (ZOLOFT) 100 MG tablet; TAKE 1 TABLET BY MOUTH DAILY.  Anxiety -     LORazepam (ATIVAN) 0.5 MG tablet; Take 1 tablet (0.5 mg total) by mouth every 8 (eight) hours as needed for anxiety.  Trouble in sleeping   Patient is currently under quite a  bit of stress.  The Ativan has helped her in the past as needed.  She is not using this every day.  Discussed risks of dependency.  Patient will continue to use as needed.  Patient will continue Zoloft.  Discussed other ways to relieve stress with exercise, meditation, reading, deep breathing, essential oils, massage. If having to take ativan daily need to consider adding buspar. Discussed good sleep. Consider OTC unisom.   Follow-up as needed or in 6 months.  Follow Up Instructions:    I discussed the assessment and treatment plan with the patient. The patient was provided an opportunity to ask questions and all were answered. The patient agreed with the plan and demonstrated an understanding of the instructions.   The patient was advised to call back or seek an in-person evaluation if the symptoms worsen or if the condition fails to improve as anticipated.    Iran Planas, PA-C

## 2019-06-30 ENCOUNTER — Telehealth (INDEPENDENT_AMBULATORY_CARE_PROVIDER_SITE_OTHER): Payer: BC Managed Care – PPO | Admitting: Physician Assistant

## 2019-06-30 VITALS — Temp 97.1°F | Ht 67.0 in | Wt 161.0 lb

## 2019-06-30 DIAGNOSIS — M26623 Arthralgia of bilateral temporomandibular joint: Secondary | ICD-10-CM

## 2019-06-30 DIAGNOSIS — J0100 Acute maxillary sinusitis, unspecified: Secondary | ICD-10-CM

## 2019-06-30 DIAGNOSIS — Z20822 Contact with and (suspected) exposure to covid-19: Secondary | ICD-10-CM

## 2019-06-30 DIAGNOSIS — F419 Anxiety disorder, unspecified: Secondary | ICD-10-CM | POA: Diagnosis not present

## 2019-06-30 DIAGNOSIS — Z566 Other physical and mental strain related to work: Secondary | ICD-10-CM | POA: Diagnosis not present

## 2019-06-30 DIAGNOSIS — F458 Other somatoform disorders: Secondary | ICD-10-CM

## 2019-06-30 MED ORDER — AZITHROMYCIN 250 MG PO TABS: ORAL_TABLET | ORAL | 0 refills | Status: DC

## 2019-06-30 MED ORDER — LORAZEPAM 0.5 MG PO TABS: 0.5000 mg | ORAL_TABLET | Freq: Three times a day (TID) | ORAL | 2 refills | Status: DC | PRN

## 2019-06-30 NOTE — Progress Notes (Signed)
Patient ID: Laurie Velasquez, female   DOB: 1980-11-22, 39 y.o.   MRN: [id number] .Marland KitchenVirtual Visit via Video Note  I connected with Laurie Velasquez on 06/30/2019 at  1:40 PM EST by a video enabled telemedicine application and verified that I am speaking with the correct person using two identifiers.  Location: Patient: work Provider: clinic   I discussed the limitations of evaluation and management by telemedicine and the availability of in person appointments. The patient expressed understanding and agreed to proceed.  History of Present Illness: Pt is a 39 yo female who calls into the clinic with moderate sinus pressure and headache but no nasal congestion for the last week. Symptoms started really mild and gradually worsened. Last couple of days had a lot of ear pain and jaw tightness. She has a hx of grinding teeth due to anxiety. She has also been pretty stressed at work and feels like she wakes up with jaw tightness and pain. She used to use lorazepam as needed for teeth grinding but she has ran out. At first the thought the headache was allergies. She has been using flonase with no relief. No fever, chills, SOB, loss or smell or taste. Multiple people in her family are sick but tested negative for covid. She does feel like her left side is worse than right.   .. Active Ambulatory Problems    Diagnosis Date Noted  . Sacroiliac joint dysfunction of both sides 06/11/2014  . GAD (generalized anxiety disorder) 06/17/2014  . Facet hypertrophy of lumbar region 08/28/2014  . Panic attacks 07/06/2015  . Depression 07/06/2015  . Nausea without vomiting 07/06/2015  . Influenza-like illness 08/13/2017  . Trouble in sleeping 01/15/2019  . Anxiety 01/15/2019  . Stress at work 01/15/2019  . Suspected COVID-19 virus infection 06/30/2019  . Teeth grinding 07/01/2019  . Bilateral temporomandibular joint pain 07/01/2019   Resolved Ambulatory Problems    Diagnosis Date Noted  . Back pain 06/15/2014  . Pregnancy  07/06/2015   No Additional Past Medical History   Reviewed med, allergy, problem list.   Observations/Objective: No acute distress. No pain with pulling up on ears.  No cough or labored breathing.   .. Today's Vitals   06/30/19 1130  Temp: (!) 97.1 F (36.2 C)  TempSrc: Oral  Weight: 161 lb (73 kg)  Height: 5\' 7"  (1.702 m)   Body mass index is 25.22 kg/m.   .. Depression screen [facility] 2/9 06/30/2019 01/15/2019 04/02/2018 04/26/2017 04/25/2016  Decreased Interest 0 1 0 1 3  Down, Depressed, Hopeless 0 1 1 0 3  PHQ - 2 Score 0 2 1 1 6   Altered sleeping 3 3 1  0 3  Tired, decreased energy 3 3 1 2 3   Change in appetite 3 3 0 1 3  Feeling bad or failure about yourself  0 1 0 0 2  Trouble concentrating 3 3 0 0 3  Moving slowly or fidgety/restless 1 3 - 1 3  Suicidal thoughts 0 0 0 0 0  PHQ-9 Score 13 18 3 5 23   Difficult doing work/chores Not difficult at all Very difficult Not difficult at all Not difficult at all Extremely dIfficult   .Marland Kitchen GAD 7 : Generalized Anxiety Score 06/30/2019 01/15/2019 04/26/2017 04/25/2016  Nervous, Anxious, on Edge 3 3 2 3   Control/stop worrying 1 3 2 2   Worry too much - different things 1 3 1 2   Trouble relaxing 0 3 1 3   Restless 0 3 0 0  Easily annoyed or  irritable 1 3 2 3   Afraid - awful might happen 1 3 0 2  Total GAD 7 Score 7 21 8 15   Anxiety Difficulty Not difficult at all Somewhat difficult - Extremely difficult     Assessment and Plan:  Shawni was seen today for ear pain.  Diagnoses and all orders for this visit:  Suspected COVID-19 virus infection -     Novel Coronavirus, NAA ([facility])  Stress at work -     LORazepam (ATIVAN) 0.5 MG tablet; Take 1 tablet (0.5 mg total) by mouth every 8 (eight) hours as needed for anxiety.  Anxiety -     LORazepam (ATIVAN) 0.5 MG tablet; Take 1 tablet (0.5 mg total) by mouth every 8 (eight) hours as needed for anxiety.  Acute non-recurrent maxillary sinusitis -     azithromycin (ZITHROMAX) 250 MG  tablet; Take 2 tablets now and then one tablet for 4 days.  Teeth grinding -     LORazepam (ATIVAN) 0.5 MG tablet; Take 1 tablet (0.5 mg total) by mouth every 8 (eight) hours as needed for anxiety.  Bilateral temporomandibular joint pain -     LORazepam (ATIVAN) 0.5 MG tablet; Take 1 tablet (0.5 mg total) by mouth every 8 (eight) hours as needed for anxiety.   Symptoms would be mild but need to rule out covid. Pt will come by for drive by testing. Stay out of work until test results come in. Rest and hydrate.   I do think she is developing a sinus infection since at 1 wee of symptoms. Treated with zpak. Continue flonase. Refilled ativan. Continue to use as needed and sparingly for teeth grinding/TMJ. Discussed warm compresses/massage.  If anxiety continues to be a problem need to consider daily anxiety medications. Discuss muscle relaxer at bedtime to help with teeth grinding.    Follow Up Instructions:    I discussed the assessment and treatment plan with the patient. The patient was provided an opportunity to ask questions and all were answered. The patient agreed with the plan and demonstrated an understanding of the instructions.   The patient was advised to call back or seek an in-person evaluation if the symptoms worsen or if the condition fails to improve as anticipated.   Marland Kitchen, PA-C

## 2019-06-30 NOTE — Progress Notes (Deleted)
Pain in both ears L > R Started as jaw pain Last couple days moved to ears/head  Jaw tightness History of grinding teeth (anxiety) Headache - behind eyes/forehead - has seasonal allergies Was taking Lorazepam - has been out for awhile, but thinks maybe if grinding teeth at night she might need again  PHQ9 (13) -GAD7 (7) completed.

## 2019-07-01 ENCOUNTER — Encounter: Payer: Self-pay | Admitting: Physician Assistant

## 2019-07-01 DIAGNOSIS — M26623 Arthralgia of bilateral temporomandibular joint: Secondary | ICD-10-CM | POA: Insufficient documentation

## 2019-07-01 DIAGNOSIS — F458 Other somatoform disorders: Secondary | ICD-10-CM | POA: Insufficient documentation

## 2019-07-02 LAB — NOVEL CORONAVIRUS, NAA: SARS-CoV-2, NAA: NOT DETECTED

## 2019-07-02 NOTE — Progress Notes (Signed)
GREAT news. Negative for covid. Hopefully you are also feeling better. You can go back to work. Let me know if you need anything else.

## 2019-07-15 ENCOUNTER — Ambulatory Visit (INDEPENDENT_AMBULATORY_CARE_PROVIDER_SITE_OTHER): Payer: BC Managed Care – PPO | Admitting: Physician Assistant

## 2019-07-15 VITALS — BP 139/83 | HR 78 | Ht 67.0 in | Wt 164.0 lb

## 2019-07-15 DIAGNOSIS — M26622 Arthralgia of left temporomandibular joint: Secondary | ICD-10-CM

## 2019-07-15 DIAGNOSIS — M26629 Arthralgia of temporomandibular joint, unspecified side: Secondary | ICD-10-CM

## 2019-07-15 DIAGNOSIS — H9202 Otalgia, left ear: Secondary | ICD-10-CM | POA: Diagnosis not present

## 2019-07-15 MED ORDER — PREDNISONE 50 MG PO TABS: ORAL_TABLET | ORAL | 0 refills | Status: DC

## 2019-07-15 MED ORDER — BACLOFEN 10 MG PO TABS: 10.0000 mg | ORAL_TABLET | Freq: Every day | ORAL | 2 refills | Status: DC

## 2019-07-15 NOTE — Progress Notes (Signed)
Subjective:    Patient ID: Laurie Velasquez, female    DOB: August 27, 1980, 39 y.o.   MRN: [id number]  HPI  Pt is a 39 yo female who presents to the clinic with persistent left ear and jaw pain. She was seen virtually a few weeks ago and given flonase/afrin/zpak for sinusitis/?otitis media/?ETD. 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 She had one a long time ago but did not use because too uncomfortable. In the past she has had TMJ flares and massage has helped the most. She does not know where to go to get massages now. Both jaws hurt some but left much more than the right. Ibuprofen not working. Pain is fairly constant 7/10. She is under a lot of stress and she started taking ativan more regularly has not helped with jaw pain. She is exercising regularly which does help with stress.  She denies any fever, chills, body aches. She does feel like she is starting to get a dull headache.  .. Active Ambulatory Problems    Diagnosis Date Noted  . Sacroiliac joint dysfunction of both sides 06/11/2014  . GAD (generalized anxiety disorder) 06/17/2014  . Facet hypertrophy of lumbar region 08/28/2014  . Panic attacks 07/06/2015  . Depression 07/06/2015  . Nausea without vomiting 07/06/2015  . Influenza-like illness 08/13/2017  . Trouble in sleeping 01/15/2019  . Anxiety 01/15/2019  . Stress at work 01/15/2019  . Suspected COVID-19 virus infection 06/30/2019  . Teeth grinding 07/01/2019  . Bilateral temporomandibular joint pain 07/01/2019  . TMJ tenderness, right 07/16/2019  . 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 Right ear pain 07/16/2019  . Mastoid pain, right 07/16/2019   Resolved Ambulatory Problems    Diagnosis Date Noted  . Back pain 06/15/2014  . Pregnancy 07/06/2015   No Additional Past Medical History     Review of Systems See HPI.     Objective:   Physical Exam Vitals reviewed.  Constitutional:      Appearance: Normal appearance.  HENT:     Head: Normocephalic.   Comments: Pain to palpation over left TMJ joint and up into temple and down into jaw.  Tenderness over left mastoid. No warmth or erythema.     Right Ear: Tympanic membrane, ear canal and external ear normal. There is no impacted cerumen.     Left Ear: Ear canal and external ear normal. There is no impacted cerumen.     Ears:     Comments: Left TM retracted at apex of ossicle but some dull appearance at base.  Light reflex present but elongated.  No active discharge.  Pain with pulling up on ear and over tragus.     Mouth/Throat:     Mouth: Mucous membranes are moist.  Eyes:     Extraocular Movements: Extraocular movements intact.     Pupils: Pupils are equal, round, and reactive to light.  Cardiovascular:     Rate and Rhythm: Normal rate and regular rhythm.     Pulses: Normal pulses.  Pulmonary:     Effort: Pulmonary effort is normal.  Lymphadenopathy:     Cervical: No cervical adenopathy.  Neurological:     General: No focal deficit present.     Mental Status: She is alert.           Assessment & Plan:  Marland KitchenMarland KitchenArizona was seen today for follow-up.  Diagnoses and all orders for this visit:  TMJ syndrome -     baclofen (LIORESAL) 10 MG tablet;  Take 1 tablet (10 mg total) by mouth at bedtime. -     predniSONE (DELTASONE) 50 MG tablet; Take one tablet for 5 days. -     Ambulatory referral to Physical Therapy  TMJ tenderness, left -     baclofen (LIORESAL) 10 MG tablet; Take 1 tablet (10 mg total) by mouth at bedtime. -     predniSONE (DELTASONE) 50 MG tablet; Take one tablet for 5 days. -     Ambulatory referral to Physical Therapy  Left ear pain -     baclofen (LIORESAL) 10 MG tablet; Take 1 tablet (10 mg total) by mouth at bedtime. -     predniSONE (DELTASONE) 50 MG tablet; Take one tablet for 5 days. -     Ambulatory referral to Physical Therapy  Pain of left mastoid    Will treat aggressively for TMJ syndrome due to pain.  Referral made to PT in [place].  Gave HO  with exercise to get started.  Jaw rest for the next 5 days and stick to foods you do not have to chew.  Start burst of prednisone.  Use baclofen at bedtime.   She is tender to palpate mastoid bone which is concerning for infection: however, I see no infection in the ear and surrounding lymph nodes are not enlarged. I suspect this is inflammation coming from TMJ inflammation. If she does not get significant relief from prednisone then proceed with CT to rule out mastoiditis.

## 2019-07-15 NOTE — Patient Instructions (Signed)

## 2019-07-16 ENCOUNTER — Encounter: Payer: Self-pay | Admitting: Physician Assistant

## 2019-07-16 DIAGNOSIS — M26622 Arthralgia of left temporomandibular joint: Secondary | ICD-10-CM

## 2019-07-16 DIAGNOSIS — M26621 Arthralgia of right temporomandibular joint: Secondary | ICD-10-CM | POA: Insufficient documentation

## 2019-07-16 DIAGNOSIS — H9201 Otalgia, right ear: Secondary | ICD-10-CM | POA: Insufficient documentation

## 2019-07-16 DIAGNOSIS — M26629 Arthralgia of temporomandibular joint, unspecified side: Secondary | ICD-10-CM | POA: Insufficient documentation

## 2019-07-18 ENCOUNTER — Ambulatory Visit (INDEPENDENT_AMBULATORY_CARE_PROVIDER_SITE_OTHER): Payer: BC Managed Care – PPO

## 2019-07-18 ENCOUNTER — Other Ambulatory Visit: Payer: Self-pay

## 2019-07-18 DIAGNOSIS — M26622 Arthralgia of left temporomandibular joint: Secondary | ICD-10-CM | POA: Diagnosis not present

## 2019-07-18 NOTE — Telephone Encounter (Signed)
CT scan showed normal eardrum and canals, normal sinuses, no mastoid infection. Everything really reassuring. If no significant pain relief comes from PT, prednisone, rest, massage then we would consider MRI to look at TMJ disc. How are you doing today?

## 2019-11-05 ENCOUNTER — Encounter: Payer: Self-pay | Admitting: Physician Assistant

## 2019-11-05 ENCOUNTER — Other Ambulatory Visit: Payer: Self-pay

## 2019-11-05 ENCOUNTER — Encounter: Payer: Self-pay | Admitting: Family Medicine

## 2019-11-05 ENCOUNTER — Ambulatory Visit (INDEPENDENT_AMBULATORY_CARE_PROVIDER_SITE_OTHER): Payer: BC Managed Care – PPO | Admitting: Family Medicine

## 2019-11-05 VITALS — Temp 98.3°F | Ht 67.0 in | Wt 157.0 lb

## 2019-11-05 DIAGNOSIS — R42 Dizziness and giddiness: Secondary | ICD-10-CM

## 2019-11-05 NOTE — Telephone Encounter (Signed)
Patient scheduled.

## 2019-11-05 NOTE — Patient Instructions (Signed)
Very nice to meet you today! Please have labs completed.  We'll be in touch with results. Push fluids and keep yourself well hydrated.

## 2019-11-06 LAB — URINALYSIS, ROUTINE W REFLEX MICROSCOPIC
Bilirubin Urine: NEGATIVE
Glucose, UA: NEGATIVE
Hgb urine dipstick: NEGATIVE
Ketones, ur: NEGATIVE
Leukocytes,Ua: NEGATIVE
Nitrite: NEGATIVE
Protein, ur: NEGATIVE
Specific Gravity, Urine: 1.017 (ref 1.001–1.03)
pH: 5.5 (ref 5.0–8.0)

## 2019-11-06 LAB — COMPLETE METABOLIC PANEL WITH GFR
AG Ratio: 1.9 (calc) (ref 1.0–2.5)
ALT: 10 U/L (ref 6–29)
AST: 18 U/L (ref 10–30)
Albumin: 4.8 g/dL (ref 3.6–5.1)
Alkaline phosphatase (APISO): 50 U/L (ref 31–125)
BUN: 13 mg/dL (ref 7–25)
CO2: 24 mmol/L (ref 20–32)
Calcium: 9.9 mg/dL (ref 8.6–10.2)
Chloride: 106 mmol/L (ref 98–110)
Creat: 0.78 mg/dL (ref 0.50–1.10)
GFR, Est African American: 111 mL/min/{1.73_m2} (ref >=60)
GFR, Est Non African American: 96 mL/min/{1.73_m2} (ref >=60)
Globulin: 2.5 g/dL (calc) (ref 1.9–3.7)
Glucose, Bld: 90 mg/dL (ref 65–99)
Potassium: 4.5 mmol/L (ref 3.5–5.3)
Sodium: 139 mmol/L (ref 135–146)
Total Bilirubin: 0.5 mg/dL (ref 0.2–1.2)
Total Protein: 7.3 g/dL (ref 6.1–8.1)

## 2019-11-06 LAB — CBC
HCT: 42.4 % (ref 35.0–45.0)
Hemoglobin: 14.1 g/dL (ref 11.7–15.5)
MCH: 30.6 pg (ref 27.0–33.0)
MCHC: 33.3 g/dL (ref 32.0–36.0)
MCV: 92 fL (ref 80.0–100.0)
MPV: 11.3 fL (ref 7.5–12.5)
Platelets: 330 10*3/uL (ref 140–400)
RBC: 4.61 10*6/uL (ref 3.80–5.10)
RDW: 12 % (ref 11.0–15.0)
WBC: 8 10*3/uL (ref 3.8–10.8)

## 2019-11-06 LAB — TSH: TSH: 1.64 mIU/L

## 2019-11-09 DIAGNOSIS — R42 Dizziness and giddiness: Secondary | ICD-10-CM | POA: Insufficient documentation

## 2019-11-09 NOTE — Assessment & Plan Note (Signed)
-  BP looks ok today. No significant orthostasis on exam.  -Improved with increased fluids and electrolyes, encouraged to stay well hydrated especially since she will be traveling to [facility] next week.  -Check labs today including CMP, CBC, TSH and UA.

## 2019-11-09 NOTE — Progress Notes (Signed)
Laurie Velasquez - 39 y.o. female MRN [id number]  Date of birth: [birth year]  Subjective Chief Complaint  Patient presents with   Dizziness    HPI Laurie Velasquez is a 39 y.o. female with history of anxiety here today with complaint of anxiety.  She reports she has had intermittent episodes of dizziness over the past couple of weeks.  Typically occurs with sitting up or standing up.  She has checked her BP at home and has gotten low readings at times.  Her fitness tracker also alerted her that her heart rate dropped to 49.  She has started increasing her fluid and increased electrolytes.  She has felt better with this but wanted to get checked out.  She denies chest pain, shortness of breath, palpitations, nausea or vomiting.   ROS:  A comprehensive ROS was completed and negative except as noted per HPI  Allergies  Allergen Reactions   Penicillins     "allergic since was little"     History reviewed. No pertinent past medical history.  History reviewed. No pertinent surgical history.  Social History   Socioeconomic History   Marital status: Married    Spouse name: Not on file   Number of children: Not on file   Years of education: Not on file   Highest education level: Not on file  Occupational History   Not on file  Tobacco Use   Smoking status: Former Smoker    Types: Cigarettes    Quit date: 05/30/2007    Years since quitting: 12.4   Smokeless tobacco: Never Used  Substance and Sexual Activity   Alcohol use: Yes    Alcohol/week: 0.0 standard drinks    Comment: 2 drinks a week   Drug use: Not on file   Sexual activity: Not on file  Other Topics Concern   Not on file  Social History Narrative   Not on file   Social Determinants of Health   Financial Resource Strain:    Difficulty of Paying Living Expenses:   Food Insecurity:    Worried About Charity fundraiser in the Last Year:    Arboriculturist in the Last Year:   Transportation Needs:    Lexicographer (Medical):    Lack of Transportation (Non-Medical):   Physical Activity:    Days of Exercise per Week:    Minutes of Exercise per Session:   Stress:    Feeling of Stress :   Social Connections:    Frequency of Communication with Friends and Family:    Frequency of Social Gatherings with Friends and Family:    Attends Religious Services:    Active Member of Clubs or Organizations:    Attends Archivist Meetings:    Marital Status:     History reviewed. No pertinent family history.  Health Maintenance  Topic Date Due   Hepatitis C Screening  Never done   HIV Screening  Never done   PAP SMEAR-Modifier  12/30/2019   INFLUENZA VACCINE  12/28/2019   TETANUS/TDAP  10/14/2026   COVID-19 Vaccine  Completed     ----------------------------------------------------------------------------------------------------------------------------------------------------------------------------------------------------------------- Physical Exam Temp 98.3 F (36.8 C) (Oral)    Ht 5\' 7"  (1.702 m)    Wt 157 lb (71.2 kg)    LMP 10/15/2019 (Exact Date)    SpO2 97% Comment: on RA   BMI 24.59 kg/m   Physical Exam Constitutional:      Appearance: Normal appearance.  HENT:     Head: Normocephalic.  Eyes:     General: No scleral icterus. Cardiovascular:     Rate and Rhythm: Normal rate and regular rhythm.  Pulmonary:     Effort: Pulmonary effort is normal.     Breath sounds: Normal breath sounds.  Musculoskeletal:     Cervical back: Neck supple.  Skin:    General: Skin is warm and dry.     Findings: No rash.  Neurological:     General: No focal deficit present.     Mental Status: She is alert.  Psychiatric:        Mood and Affect: Mood normal.        Behavior: Behavior normal.      ------------------------------------------------------------------------------------------------------------------------------------------------------------------------------------------------------------------- Assessment and Plan  Dizziness -BP looks ok today. No significant orthostasis on exam.  -Improved with increased fluids and electrolyes, encouraged to stay well hydrated especially since she will be traveling to [facility] next week.  -Check labs today including CMP, CBC, TSH and UA.     No orders of the defined types were placed in this encounter.   No follow-ups on file.    This visit occurred during the SARS-CoV-2 public health emergency.  Safety protocols were in place, including screening questions prior to the visit, additional usage of staff PPE, and extensive cleaning of exam room while observing appropriate contact time as indicated for disinfecting solutions.

## 2020-01-02 ENCOUNTER — Other Ambulatory Visit: Payer: Self-pay | Admitting: Neurology

## 2020-01-02 DIAGNOSIS — F419 Anxiety disorder, unspecified: Secondary | ICD-10-CM

## 2020-01-02 DIAGNOSIS — F458 Other somatoform disorders: Secondary | ICD-10-CM

## 2020-01-02 DIAGNOSIS — M26623 Arthralgia of bilateral temporomandibular joint: Secondary | ICD-10-CM

## 2020-01-02 DIAGNOSIS — Z566 Other physical and mental strain related to work: Secondary | ICD-10-CM

## 2020-01-02 MED ORDER — LORAZEPAM 0.5 MG PO TABS: 0.5000 mg | ORAL_TABLET | Freq: Three times a day (TID) | ORAL | 0 refills | Status: DC | PRN

## 2020-01-02 NOTE — Telephone Encounter (Signed)
Last filled 06/30/2019 #30 with 2 refills Last appt 11/05/2019 - dizziness with Dr. Ashley Royalty [id number] performed at visit

## 2020-01-28 ENCOUNTER — Emergency Department: Admit: 2020-01-28 | Discharge status: Absent without leave | Payer: Self-pay

## 2020-02-19 ENCOUNTER — Encounter: Payer: Self-pay | Admitting: Physician Assistant

## 2020-02-19 DIAGNOSIS — M542 Cervicalgia: Secondary | ICD-10-CM

## 2020-02-19 DIAGNOSIS — M26629 Arthralgia of temporomandibular joint, unspecified side: Secondary | ICD-10-CM

## 2020-02-19 DIAGNOSIS — H9202 Otalgia, left ear: Secondary | ICD-10-CM

## 2020-02-19 DIAGNOSIS — M26622 Arthralgia of left temporomandibular joint: Secondary | ICD-10-CM

## 2020-02-19 MED ORDER — BACLOFEN 10 MG PO TABS: 10.0000 mg | ORAL_TABLET | Freq: Every day | ORAL | 2 refills | Status: DC

## 2020-02-19 NOTE — Telephone Encounter (Signed)
Rx for Baclofen pended. Physical therapy referral pended.

## 2020-03-02 ENCOUNTER — Encounter: Payer: Self-pay | Admitting: Physician Assistant

## 2020-03-03 ENCOUNTER — Encounter: Payer: Self-pay | Admitting: Physician Assistant

## 2020-03-03 ENCOUNTER — Telehealth (INDEPENDENT_AMBULATORY_CARE_PROVIDER_SITE_OTHER): Payer: BC Managed Care – PPO | Admitting: Physician Assistant

## 2020-03-03 VITALS — Temp 98.3°F | Wt 165.0 lb

## 2020-03-03 DIAGNOSIS — E663 Overweight: Secondary | ICD-10-CM

## 2020-03-03 DIAGNOSIS — F17200 Nicotine dependence, unspecified, uncomplicated: Secondary | ICD-10-CM | POA: Diagnosis not present

## 2020-03-03 DIAGNOSIS — U071 COVID-19: Secondary | ICD-10-CM | POA: Diagnosis not present

## 2020-03-03 MED ORDER — ALBUTEROL SULFATE HFA 108 (90 BASE) MCG/ACT IN AERS
2.0000 | INHALATION_SPRAY | Freq: Four times a day (QID) | RESPIRATORY_TRACT | 0 refills | Status: DC | PRN
Start: 2020-03-03 — End: 2020-03-30

## 2020-03-03 NOTE — Progress Notes (Signed)
Patient ID: Laurie Velasquez, female   DOB: [birth year], 39 y.o.   MRN: [id number] .Marland KitchenVirtual Visit via Video Note  I connected with Laurie Velasquez on 03/03/20 at  2:20 PM EDT by a video enabled telemedicine application and verified that I am speaking with the correct person using two identifiers.  Location: Patient: home Provider: clinic   I discussed the limitations of evaluation and management by telemedicine and the availability of in person appointments. The patient expressed understanding and agreed to proceed.  History of Present Illness: Pt is a 39 yo overweight female who calls into the clinic testing positive for covid. She started having symptoms on Sunday October 3rd.  She had a home rapid Covid test which came out positive.  She went to [facility] urgent care and was tested PCR for Covid which resulted positive yesterday.  Her symptoms do continue to worsen.  She has a lot of fatigue, cough, weakness.  She has taken Mucinex, ibuprofen, Tylenol.  She is a smoker.  She wonders if she would qualify for monoclonal antibodies.  She denies any significant shortness of breath.  She did have her [employer] vaccine.  .. Active Ambulatory Problems    Diagnosis Date Noted  . Sacroiliac joint dysfunction of both sides 06/11/2014  . GAD (generalized anxiety disorder) 06/17/2014  . Facet hypertrophy of lumbar region 08/28/2014  . Panic attacks 07/06/2015  . Depression 07/06/2015  . Nausea without vomiting 07/06/2015  . Influenza-like illness 08/13/2017  . Trouble in sleeping 01/15/2019  . Anxiety 01/15/2019  . Stress at work 01/15/2019  . Suspected COVID-19 virus infection 06/30/2019  . Teeth grinding 07/01/2019  . Bilateral temporomandibular joint pain 07/01/2019  . TMJ tenderness, right 07/16/2019  . 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 Right ear pain 07/16/2019  . Mastoid pain, right 07/16/2019  . Dizziness 11/09/2019  . Smoker 03/03/2020  . COVID-19 virus infection 03/03/2020  . Overweight (BMI  25.0-29.9) 03/03/2020   Resolved Ambulatory Problems    Diagnosis Date Noted  . Back pain 06/15/2014  . Pregnancy 07/06/2015   No Additional Past Medical History   Reviewed med, allergy, problem list.   Observations/Objective: No acute distress Productive cough No labored breathing  .Marland Kitchen Today's Vitals   03/03/20 1428  Temp: 98.3 F (36.8 C)  TempSrc: Oral  Weight: 165 lb (74.8 kg)   Body mass index is 25.84 kg/m.    Assessment and Plan: Marland KitchenMarland KitchenCyana was seen today for covid positive.  Diagnoses and all orders for this visit:  COVID-19 virus infection  Smoker  Overweight (BMI 25.0-29.9)  Other orders -     albuterol (VENTOLIN HFA) 108 (90 Base) MCG/ACT inhaler; Inhale 2 puffs into the lungs every 6 (six) hours as needed.   Discuss positive Covid test.  She should be out of quarantine on the 14th.  Discussed symptomatic care with vitamin D, zinc, vitamin C, Mucinex, ibuprofen, Tylenol.  I did send over some albuterol as needed for shortness of breath and breathing issues.  I am unsure if she will qualify for monoclonal antibodies.  I will send a message for screening since she is worsening with symptoms.  Discussed the progression of Covid.  Encourage patient to start daily aspirin 81 mg to prevent blood clots.  Discussed red flag symptoms and when to seek urgent care or ED evaluation.  Continue to take good deep breaths every hour and stay active.  Stay hydrated.  Follow-up as needed.   Follow Up Instructions:    I discussed the assessment  and treatment plan with the patient. The patient was provided an opportunity to ask questions and all were answered. The patient agreed with the plan and demonstrated an understanding of the instructions.   The patient was advised to call back or seek an in-person evaluation if the symptoms worsen or if the condition fails to improve as anticipated.  I provided 25 minutes of non-face-to-face time during this encounter.   Tandy Gaw, PA-C

## 2020-03-21 ENCOUNTER — Other Ambulatory Visit: Payer: Self-pay | Admitting: Physician Assistant

## 2020-03-21 DIAGNOSIS — F331 Major depressive disorder, recurrent, moderate: Secondary | ICD-10-CM

## 2020-03-26 ENCOUNTER — Encounter: Payer: Self-pay | Admitting: Physician Assistant

## 2020-03-30 ENCOUNTER — Other Ambulatory Visit: Payer: Self-pay

## 2020-03-30 ENCOUNTER — Ambulatory Visit: Payer: BC Managed Care – PPO | Admitting: Physician Assistant

## 2020-03-30 VITALS — BP 116/72 | HR 86 | Ht 67.0 in | Wt 159.0 lb

## 2020-03-30 DIAGNOSIS — F410 Panic disorder [episodic paroxysmal anxiety] without agoraphobia: Secondary | ICD-10-CM

## 2020-03-30 DIAGNOSIS — F458 Other somatoform disorders: Secondary | ICD-10-CM | POA: Diagnosis not present

## 2020-03-30 DIAGNOSIS — M26623 Arthralgia of bilateral temporomandibular joint: Secondary | ICD-10-CM

## 2020-03-30 DIAGNOSIS — F419 Anxiety disorder, unspecified: Secondary | ICD-10-CM

## 2020-03-30 DIAGNOSIS — F411 Generalized anxiety disorder: Secondary | ICD-10-CM

## 2020-03-30 DIAGNOSIS — Z566 Other physical and mental strain related to work: Secondary | ICD-10-CM | POA: Diagnosis not present

## 2020-03-30 DIAGNOSIS — F331 Major depressive disorder, recurrent, moderate: Secondary | ICD-10-CM

## 2020-03-30 MED ORDER — SERTRALINE HCL 100 MG PO TABS: ORAL_TABLET | ORAL | 1 refills | Status: DC

## 2020-03-30 MED ORDER — BUSPIRONE HCL 7.5 MG PO TABS: 7.5000 mg | ORAL_TABLET | Freq: Three times a day (TID) | ORAL | 1 refills | Status: DC

## 2020-03-30 MED ORDER — LORAZEPAM 0.5 MG PO TABS: 0.5000 mg | ORAL_TABLET | Freq: Three times a day (TID) | ORAL | 2 refills | Status: DC | PRN

## 2020-03-30 NOTE — Progress Notes (Signed)
Subjective:    Patient ID: Laurie Velasquez, female    DOB: 01/09/1981, 39 y.o.   MRN: [id number]  HPI  Patient is a 39 year old female with history of depression anxiety who presents to the clinic with worsening stress, depression, anxiety.  Patient is actually having worse hemic symptoms from her anxiety such as teeth grinding and TMJ joint dysfunction.  Physical therapy has helped her significantly but she has not been able to go lately.  Her stress at work continues to increase.  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  They have been under a lot of stress because of kids bring dangerous guns and knives to school.  She feels like administration is not handling this appropriately.  She does feel unsafe at school.  She also feels like she is being asked to do all these things and not respected by menstruation.  All the stress is building on her.  She is having more panic attacks at work.  She used to only take Ativan once or twice a month.  Now she is taking 1 every day to school just in case she needs it.  She is having trouble just opening her email without having significant anxiety.  She has with the formal complaint with the school regarding some of their protocols.  She is asking that I fill out some FMLA for 1 to 3 days a month in case she has a panic attack and needs to leave work.  She is on Zoloft 100 mg right now.  She did try to go up to 1-1/2 and she felt to "numb".  Right now she definitely feels like her anxiety is more of a problem than her depression.  Patient denies any suicidal thoughts or homicidal idealizations.  She admits she is not doing the things she could do such as exercise yoga meditation to help with stress.  She is trying to learn some breathing exercises.  .. Active Ambulatory Problems    Diagnosis Date Noted  . Sacroiliac joint dysfunction of both sides 06/11/2014  . GAD (generalized anxiety disorder) 06/17/2014  . Facet hypertrophy of lumbar region  08/28/2014  . Panic attacks 07/06/2015  . Depression 07/06/2015  . Nausea without vomiting 07/06/2015  . Influenza-like illness 08/13/2017  . Trouble in sleeping 01/15/2019  . Anxiety 01/15/2019  . Stress at work 01/15/2019  . Suspected COVID-19 virus infection 06/30/2019  . Teeth grinding 07/01/2019  . Bilateral temporomandibular joint pain 07/01/2019  . TMJ tenderness, right 07/16/2019  . 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 Right ear pain 07/16/2019  . Mastoid pain, right 07/16/2019  . Dizziness 11/09/2019  . Smoker 03/03/2020  . COVID-19 virus infection 03/03/2020  . Overweight (BMI 25.0-29.9) 03/03/2020   Resolved Ambulatory Problems    Diagnosis Date Noted  . Back pain 06/15/2014  . Pregnancy 07/06/2015   No Additional Past Medical History     Review of Systems  All other systems reviewed and are negative.      Objective:   Physical Exam Vitals reviewed.  Constitutional:      Appearance: Normal appearance.  HENT:     Head: Normocephalic.  Cardiovascular:     Rate and Rhythm: Normal rate and regular rhythm.     Pulses: Normal pulses.  Pulmonary:     Effort: Pulmonary effort is normal.     Breath sounds: Normal breath sounds.  Neurological:     General: No focal deficit present.     Mental Status: She  is alert and oriented to person, place, and time.  Psychiatric:        Mood and Affect: Mood normal.     .. Depression screen [facility] 2/9 03/30/2020 11/05/2019 07/15/2019 06/30/2019 01/15/2019  Decreased Interest 3 0 0 0 1  Down, Depressed, Hopeless 3 0 0 0 1  PHQ - 2 Score 6 0 0 0 2  Altered sleeping 3 3 2 3 3   Tired, decreased energy 3 3 2 3 3   Change in appetite 3 3 2 3 3   Feeling bad or failure about yourself  3 0 2 0 1  Trouble concentrating 3 0 3 3 3   Moving slowly or fidgety/restless 3 0 2 1 3   Suicidal thoughts 0 0 0 0 0  PHQ-9 Score 24 9 13 13 18   Difficult doing work/chores Extremely dIfficult Somewhat difficult Somewhat difficult Not difficult at all Very  difficult  Some recent data might be hidden   . GAD 7 : Generalized Anxiety Score 03/30/2020 11/05/2019 07/15/2019 06/30/2019  Nervous, Anxious, on Edge 3 1 3 3   Control/stop worrying 3 0 3 1  Worry too much - different things 3 0 3 1  Trouble relaxing 0 1 3 0  Restless 0 0 2 0  Easily annoyed or irritable 3 1 3 1   Afraid - awful might happen 3 0 3 1  Total GAD 7 Score 15 3 20 7   Anxiety Difficulty Extremely difficult Not difficult at all Somewhat difficult Not difficult at all          Assessment & Plan:  Marland KitchenMaisyn was seen today for anxiety.  Diagnoses and all orders for this visit:  Panic attacks -     busPIRone (BUSPAR) 7.5 MG tablet; Take 1 tablet (7.5 mg total) by mouth 3 (three) times daily.  Stress at work -     LORazepam (ATIVAN) 0.5 MG tablet; Take 1 tablet (0.5 mg total) by mouth every 8 (eight) hours as needed for anxiety.  Anxiety -     LORazepam (ATIVAN) 0.5 MG tablet; Take 1 tablet (0.5 mg total) by mouth every 8 (eight) hours as needed for anxiety. -     busPIRone (BUSPAR) 7.5 MG tablet; Take 1 tablet (7.5 mg total) by mouth 3 (three) times daily.  Teeth grinding -     LORazepam (ATIVAN) 0.5 MG tablet; Take 1 tablet (0.5 mg total) by mouth every 8 (eight) hours as needed for anxiety.  Bilateral temporomandibular joint pain -     LORazepam (ATIVAN) 0.5 MG tablet; Take 1 tablet (0.5 mg total) by mouth every 8 (eight) hours as needed for anxiety.  Moderate episode of recurrent major depressive disorder (HCC) -     sertraline (ZOLOFT) 100 MG tablet; TAKE 1 TABLET BY MOUTH DAILY  GAD (generalized anxiety disorder) -     busPIRone (BUSPAR) 7.5 MG tablet; Take 1 tablet (7.5 mg total) by mouth 3 (three) times daily.   pH Q9 and GAD 7 for off the charts.  We will keep Zoloft at 100 mg daily.  Added BuSpar 7.5 mg up to 3 times a day.  Discussed side effects and how to use this medication.  I did go ahead and refill Ativan but discussed to continue to use this for sparing  as needed usage.  Discussed abuse potential with patient.  I am more than happy to fill out the FMLA for 1 to 3 days a month if she were to have a panic attack that she could go home.  Please drop off that paperwork.  Continue to use breathing exercises.  Consider counseling.  Discussed realistic expectations and how employers employees are very stressed right now and have a lot of workload.  Work but she does not have to obsess about workload.  Follow-up in 2 months or sooner if needed.  Spent 35 minutes with patient discussing anxiety and how to treat.

## 2020-03-31 ENCOUNTER — Encounter: Payer: Self-pay | Admitting: Physician Assistant

## 2020-04-12 ENCOUNTER — Encounter: Payer: Self-pay | Admitting: Physician Assistant

## 2020-05-30 ENCOUNTER — Other Ambulatory Visit: Payer: Self-pay | Admitting: Physician Assistant

## 2020-05-30 DIAGNOSIS — F419 Anxiety disorder, unspecified: Secondary | ICD-10-CM

## 2020-05-30 DIAGNOSIS — F410 Panic disorder [episodic paroxysmal anxiety] without agoraphobia: Secondary | ICD-10-CM

## 2020-05-30 DIAGNOSIS — F411 Generalized anxiety disorder: Secondary | ICD-10-CM

## 2020-05-31 ENCOUNTER — Ambulatory Visit: Payer: BC Managed Care – PPO | Admitting: Physician Assistant

## 2020-06-04 ENCOUNTER — Ambulatory Visit (INDEPENDENT_AMBULATORY_CARE_PROVIDER_SITE_OTHER): Payer: BC Managed Care – PPO | Admitting: Physician Assistant

## 2020-06-04 ENCOUNTER — Other Ambulatory Visit (HOSPITAL_COMMUNITY)
Admission: RE | Admit: 2020-06-04 | Discharge: 2020-06-04 | Disposition: A | Discharge status: Home / Self Care | Payer: Self-pay | Source: Ambulatory Visit | Attending: Physician Assistant | Admitting: Physician Assistant

## 2020-06-04 ENCOUNTER — Other Ambulatory Visit: Payer: Self-pay

## 2020-06-04 VITALS — BP 120/71 | HR 80 | Wt 162.9 lb

## 2020-06-04 DIAGNOSIS — Z13220 Encounter for screening for lipoid disorders: Secondary | ICD-10-CM

## 2020-06-04 DIAGNOSIS — Z1159 Encounter for screening for other viral diseases: Secondary | ICD-10-CM | POA: Diagnosis not present

## 2020-06-04 DIAGNOSIS — Z0000 Encounter for general adult medical examination without abnormal findings: Secondary | ICD-10-CM

## 2020-06-04 DIAGNOSIS — Z124 Encounter for screening for malignant neoplasm of cervix: Secondary | ICD-10-CM

## 2020-06-04 DIAGNOSIS — Z131 Encounter for screening for diabetes mellitus: Secondary | ICD-10-CM | POA: Diagnosis not present

## 2020-06-04 DIAGNOSIS — Z1231 Encounter for screening mammogram for malignant neoplasm of breast: Secondary | ICD-10-CM

## 2020-06-04 NOTE — Progress Notes (Signed)
Subjective:    Patient ID: Laurie Velasquez, female    DOB: Jul 01, 1980, 40 y.o.   MRN: [id number]  HPI Patient is a 40 year old female who presents to the clinic for complete physical and Pap smear.  Patient is doing well.  Overall her mood is controlled.  She still has some stress at work but it is better on medication.  .. 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   Right ear pain 07/16/2019   Mastoid pain, right 07/16/2019   Dizziness 11/09/2019   Smoker 03/03/2020   COVID-19 virus infection 03/03/2020   Overweight (BMI 25.0-29.9) 03/03/2020   Resolved Ambulatory Problems    Diagnosis Date Noted   Back pain 06/15/2014   Pregnancy 07/06/2015   No Additional Past Medical History   .Marland Kitchen Social History   Socioeconomic History   Marital status: Married    Spouse name: Not on file   Number of children: Not on file   Years of education: Not on file   Highest education level: Not on file  Occupational History   Not on file  Tobacco Use   Smoking status: Former Smoker    Types: Cigarettes    Quit date: 05/30/2007    Years since quitting: 13.0   Smokeless tobacco: Never Used  Substance and Sexual Activity   Alcohol use: Yes    Alcohol/week: 0.0 standard drinks    Comment: 2 drinks a week   Drug use: Not on file   Sexual activity: Not on file  Other Topics Concern   Not on file  Social  History Narrative   Not on file   Social Determinants of Health   Financial Resource Strain: Not on file  Food Insecurity: Not on file  Transportation Needs: Not on file  Physical Activity: Not on file  Stress: Not on file  Social Connections: Not on file  Intimate Partner Violence: Not on file        Review of Systems  All other systems reviewed and are negative.      Objective:   Physical Exam Vitals reviewed.  Constitutional:      Appearance: Normal appearance.  HENT:     Head: Normocephalic.     Right Ear: Tympanic membrane normal.     Left Ear: Tympanic membrane normal.     Nose: Nose normal.  Eyes:     Extraocular Movements: Extraocular movements intact.     Conjunctiva/sclera: Conjunctivae normal.     Pupils: Pupils are equal, round, and reactive to light.  Neck:     Vascular: No carotid bruit.  Cardiovascular:     Rate and Rhythm: Normal rate and regular rhythm.     Pulses: Normal pulses.  Pulmonary:     Effort: Pulmonary effort is normal.     Breath sounds: Normal breath sounds.  Abdominal:     General: Bowel sounds are normal. There is no distension.     Palpations: Abdomen is soft.  Tenderness: There is no abdominal tenderness. There is no left CVA tenderness.  Genitourinary:    General: Normal vulva.     Vagina: No vaginal discharge.     Rectum: Guaiac result negative.  Musculoskeletal:     Cervical back: Normal range of motion. No tenderness.     Right lower leg: No edema.     Left lower leg: No edema.  Lymphadenopathy:     Cervical: No cervical adenopathy.  Skin:    Findings: No rash.  Neurological:     General: No focal deficit present.     Mental Status: She is alert and oriented to person, place, and time.  Psychiatric:        Mood and Affect: Mood normal.     .. Depression screen [facility] 2/9 06/11/2020 03/30/2020 11/05/2019 07/15/2019 06/30/2019  Decreased Interest 1 3 0 0 0  Down, Depressed, Hopeless 1 3 0 0 0  PHQ - 2 Score 2 6 0 0  0  Altered sleeping 0 3 3 2 3   Tired, decreased energy 1 3 3 2 3   Change in appetite 0 3 3 2 3   Feeling bad or failure about yourself  1 3 0 2 0  Trouble concentrating 0 3 0 3 3  Moving slowly or fidgety/restless 0 3 0 2 1  Suicidal thoughts 0 0 0 0 0  PHQ-9 Score 4 24 9 13 13   Difficult doing work/chores Somewhat difficult Extremely dIfficult Somewhat difficult Somewhat difficult Not difficult at all  Some recent data might be hidden   . GAD 7 : Generalized Anxiety Score 06/11/2020 03/30/2020 11/05/2019 07/15/2019  Nervous, Anxious, on Edge 1 3 1 3   Control/stop worrying 1 3 0 3  Worry too much - different things 1 3 0 3  Trouble relaxing 1 0 1 3  Restless 1 0 0 2  Easily annoyed or irritable 1 3 1 3   Afraid - awful might happen 1 3 0 3  Total GAD 7 Score 7 15 3 20   Anxiety Difficulty Not difficult at all Extremely difficult Not difficult at all Somewhat difficult          Assessment & Plan:  1/16/202213/2/2021Diagnoses and all orders for this visit:  Routine physical examination -     Lipid Panel w/reflex Direct LDL -     COMPLETE METABOLIC PANEL WITH GFR -     Hepatitis C Antibody -     MM 3D SCREEN BREAST BILATERAL  Screening for diabetes mellitus -     COMPLETE METABOLIC PANEL WITH GFR  Screening for lipid disorders -     Lipid Panel w/reflex Direct LDL  Encounter for hepatitis C screening test for low risk patient -     Hepatitis C Antibody  Visit for screening mammogram -     MM 3D SCREEN BREAST BILATERAL  Cervical cancer screening -     Cytology - PAP   .01/05/2020 Discussed 150 minutes of exercise a week.  Encouraged vitamin D 1000 units and Calcium 1300mg  or 4 servings of dairy a day.  Fasting labs ordered.  Mammo ordered.  Pap done today.  PHQ/GAD stable. Refilled medications.  covid and flu shot UTD. Needs covid booster.

## 2020-06-04 NOTE — Patient Instructions (Signed)
Health Maintenance, Female Adopting a healthy lifestyle and getting preventive care are important in promoting health and wellness. Ask your health care provider about:  The right schedule for you to have regular tests and exams.  Things you can do on your own to prevent diseases and keep yourself healthy. What should I know about diet, weight, and exercise? Eat a healthy diet   Eat a diet that includes plenty of vegetables, fruits, low-fat dairy products, and lean protein.  Do not eat a lot of foods that are high in solid fats, added sugars, or sodium. Maintain a healthy weight Body mass index (BMI) is used to identify weight problems. It estimates body fat based on height and weight. Your health care provider can help determine your BMI and help you achieve or maintain a healthy weight. Get regular exercise Get regular exercise. This is one of the most important things you can do for your health. Most adults should:  Exercise for at least 150 minutes each week. The exercise should increase your heart rate and make you sweat (moderate-intensity exercise).  Do strengthening exercises at least twice a week. This is in addition to the moderate-intensity exercise.  Spend less time sitting. Even light physical activity can be beneficial. Watch cholesterol and blood lipids Have your blood tested for lipids and cholesterol at 41 years of age, then have this test every 5 years. Have your cholesterol levels checked more often if:  Your lipid or cholesterol levels are high.  You are older than 40 years of age.  You are at high risk for heart disease. What should I know about cancer screening? Depending on your health history and family history, you may need to have cancer screening at various ages. This may include screening for:  Breast cancer.  Cervical cancer.  Colorectal cancer.  Skin cancer.  Lung cancer. What should I know about heart disease, diabetes, and high blood  pressure? Blood pressure and heart disease  High blood pressure causes heart disease and increases the risk of stroke. This is more likely to develop in people who have high blood pressure readings, are of African descent, or are overweight.  Have your blood pressure checked: ? Every 3-5 years if you are 18-39 years of age. ? Every year if you are 40 years old or older. Diabetes Have regular diabetes screenings. This checks your fasting blood sugar level. Have the screening done:  Once every three years after age 40 if you are at a normal weight and have a low risk for diabetes.  More often and at a younger age if you are overweight or have a high risk for diabetes. What should I know about preventing infection? Hepatitis B If you have a higher risk for hepatitis B, you should be screened for this virus. Talk with your health care provider to find out if you are at risk for hepatitis B infection. Hepatitis C Testing is recommended for:  Everyone born from 1945 through 1965.  Anyone with known risk factors for hepatitis C. Sexually transmitted infections (STIs)  Get screened for STIs, including gonorrhea and chlamydia, if: ? You are sexually active and are younger than 41 years of age. ? You are older than 41 years of age and your health care provider tells you that you are at risk for this type of infection. ? Your sexual activity has changed since you were last screened, and you are at increased risk for chlamydia or gonorrhea. Ask your health care provider if   you are at risk.  Ask your health care provider about whether you are at high risk for HIV. Your health care provider may recommend a prescription medicine to help prevent HIV infection. If you choose to take medicine to prevent HIV, you should first get tested for HIV. You should then be tested every 3 months for as long as you are taking the medicine. Pregnancy  If you are about to stop having your period (premenopausal) and  you may become pregnant, seek counseling before you get pregnant.  Take 400 to 800 micrograms (mcg) of folic acid every day if you become pregnant.  Ask for birth control (contraception) if you want to prevent pregnancy. Osteoporosis and menopause Osteoporosis is a disease in which the bones lose minerals and strength with aging. This can result in bone fractures. If you are 65 years old or older, or if you are at risk for osteoporosis and fractures, ask your health care provider if you should:  Be screened for bone loss.  Take a calcium or vitamin D supplement to lower your risk of fractures.  Be given hormone replacement therapy (HRT) to treat symptoms of menopause. Follow these instructions at home: Lifestyle  Do not use any products that contain nicotine or tobacco, such as cigarettes, e-cigarettes, and chewing tobacco. If you need help quitting, ask your health care provider.  Do not use street drugs.  Do not share needles.  Ask your health care provider for help if you need support or information about quitting drugs. Alcohol use  Do not drink alcohol if: ? Your health care provider tells you not to drink. ? You are pregnant, may be pregnant, or are planning to become pregnant.  If you drink alcohol: ? Limit how much you use to 0-1 drink a day. ? Limit intake if you are breastfeeding.  Be aware of how much alcohol is in your drink. In the [place] one drink equals one 12 oz bottle of beer (355 mL), one 5 oz glass of wine (148 mL), or one 1 oz glass of hard liquor (44 mL). General instructions  Schedule regular health, dental, and eye exams.  Stay current with your vaccines.  Tell your health care provider if: ? You often feel depressed. ? You have ever been abused or do not feel safe at home. Summary  Adopting a healthy lifestyle and getting preventive care are important in promoting health and wellness.  Follow your health care provider's instructions about healthy  diet, exercising, and getting tested or screened for diseases.  Follow your health care provider's instructions on monitoring your cholesterol and blood pressure. This information is not intended to replace advice given to you by your health care provider. Make sure you discuss any questions you have with your health care provider. Document Revised: 05/08/2018 Document Reviewed: 05/08/2018 Elsevier Patient Education  2020 [employer]  

## 2020-06-09 LAB — CYTOLOGY - PAP
Comment: NEGATIVE
Diagnosis: NEGATIVE
High risk HPV: NEGATIVE

## 2020-06-10 ENCOUNTER — Encounter: Payer: Self-pay | Admitting: Physician Assistant

## 2020-06-10 NOTE — Progress Notes (Signed)
No abnormal cells and negative for HPV. We can go 3-5 years based on these normal results

## 2020-06-11 ENCOUNTER — Encounter: Payer: Self-pay | Admitting: Physician Assistant

## 2020-06-29 ENCOUNTER — Other Ambulatory Visit: Payer: Self-pay | Admitting: Physician Assistant

## 2020-06-29 DIAGNOSIS — F331 Major depressive disorder, recurrent, moderate: Secondary | ICD-10-CM

## 2020-07-29 ENCOUNTER — Other Ambulatory Visit: Payer: Self-pay | Admitting: Physician Assistant

## 2020-07-29 DIAGNOSIS — F458 Other somatoform disorders: Secondary | ICD-10-CM

## 2020-07-29 DIAGNOSIS — M26623 Arthralgia of bilateral temporomandibular joint: Secondary | ICD-10-CM

## 2020-07-29 DIAGNOSIS — Z566 Other physical and mental strain related to work: Secondary | ICD-10-CM

## 2020-07-29 DIAGNOSIS — F419 Anxiety disorder, unspecified: Secondary | ICD-10-CM

## 2020-08-01 ENCOUNTER — Encounter: Payer: Self-pay | Admitting: Physician Assistant

## 2020-08-03 ENCOUNTER — Encounter: Payer: Self-pay | Admitting: Physician Assistant

## 2020-08-03 ENCOUNTER — Telehealth (INDEPENDENT_AMBULATORY_CARE_PROVIDER_SITE_OTHER): Payer: BC Managed Care – PPO | Admitting: Physician Assistant

## 2020-08-03 DIAGNOSIS — J302 Other seasonal allergic rhinitis: Secondary | ICD-10-CM | POA: Insufficient documentation

## 2020-08-03 DIAGNOSIS — J0100 Acute maxillary sinusitis, unspecified: Secondary | ICD-10-CM | POA: Diagnosis not present

## 2020-08-03 MED ORDER — METHYLPREDNISOLONE 4 MG PO TBPK: ORAL_TABLET | ORAL | 0 refills | Status: DC

## 2020-08-03 MED ORDER — AZITHROMYCIN 250 MG PO TABS: ORAL_TABLET | ORAL | 0 refills | Status: DC

## 2020-08-03 NOTE — Progress Notes (Signed)
Patient ID: Laurie Velasquez, female   DOB: 01/24/1981, 40 y.o.   MRN: [id number] .Marland KitchenVirtual Visit via Telephone Note  I connected with Laurie Velasquez on 08/03/20 at 11:30 AM EST by telephone and verified that I am speaking with the correct person using two identifiers.  Not able to connect to video.   Location: Patient: work Provider: clinic  .Marland KitchenParticipating in visit:  Patient: Tiffanni Provider: Tandy Gaw PA-C   I discussed the limitations, risks, security and privacy concerns of performing an evaluation and management service by telephone and the availability of in person appointments. I also discussed with the patient that there may be a patient responsible charge related to this service. The patient expressed understanding and agreed to proceed.   History of Present Illness: Patient is a 40 year old female with seasonal allergies and history of seasonal sinus infections due to allergies who presents via telephone to the clinic to discuss symptoms.  Patient's allergies started last week when the weather got warm.  She started Zyrtec and Flonase.  A few days into which she ended up switching to Allegra and Flonase.  She had minimal relief.  She now has a lot of sinus pressure and congestion.  She has a ongoing headache.  She can feel the pressure changes when she turns her head.  She denies any fever, chills, shortness of breath or cough.  She does have some throat clearing.  .. Active Ambulatory Problems    Diagnosis Date Noted  . Sacroiliac joint dysfunction of both sides 06/11/2014  . GAD (generalized anxiety disorder) 06/17/2014  . Facet hypertrophy of lumbar region 08/28/2014  . Panic attacks 07/06/2015  . Depression 07/06/2015  . Nausea without vomiting 07/06/2015  . Influenza-like illness 08/13/2017  . Trouble in sleeping 01/15/2019  . Anxiety 01/15/2019  . Stress at work 01/15/2019  . Suspected COVID-19 virus infection 06/30/2019  . Teeth grinding 07/01/2019  . Bilateral  temporomandibular joint pain 07/01/2019  . TMJ tenderness, right 07/16/2019  . 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 Right ear pain 07/16/2019  . Mastoid pain, right 07/16/2019  . Dizziness 11/09/2019  . Smoker 03/03/2020  . COVID-19 virus infection 03/03/2020  . Overweight (BMI 25.0-29.9) 03/03/2020  . Seasonal allergies 08/03/2020   Resolved Ambulatory Problems    Diagnosis Date Noted  . Back pain 06/15/2014  . Pregnancy 07/06/2015   No Additional Past Medical History   Reviewed med, allergy, problem list.     Observations/Objective: No acute distress. Normal breathing without cough or wheezing.   Assessment and Plan: Marland KitchenMarland KitchenLaylaa was seen today for allergies.  Diagnoses and all orders for this visit:  Acute non-recurrent maxillary sinusitis -     methylPREDNISolone (MEDROL DOSEPAK) 4 MG TBPK tablet; Take as directed by package insert. -     azithromycin (ZITHROMAX Z-PAK) 250 MG tablet; Take 2 tablets (500 mg) on  Day 1,  followed by 1 tablet (250 mg) once daily on Days 2 through 5.  Seasonal allergies -     methylPREDNISolone (MEDROL DOSEPAK) 4 MG TBPK tablet; Take as directed by package insert. -     azithromycin (ZITHROMAX Z-PAK) 250 MG tablet; Take 2 tablets (500 mg) on  Day 1,  followed by 1 tablet (250 mg) once daily on Days 2 through 5.   Discussed ways to control seasonal allergies.  Continue on Allegra and Flonase.  Consider nasal saline washes.  I do think this is more of allergic sinusitis.  Start with Medrol Dosepak.  Only start Z-Pak if symptoms have  progressed for longer than 7 to 10 days and seem like they are worsening with more green discharge and pressure.   Follow Up Instructions:    I discussed the assessment and treatment plan with the patient. The patient was provided an opportunity to ask questions and all were answered. The patient agreed with the plan and demonstrated an understanding of the instructions.   The patient was advised to call back or seek an  in-person evaluation if the symptoms worsen or if the condition fails to improve as anticipated.  I provided 10 minutes of non-face-to-face time during this encounter.   Tandy Gaw, PA-C

## 2020-09-01 ENCOUNTER — Encounter: Payer: Self-pay | Admitting: Physician Assistant

## 2020-09-07 ENCOUNTER — Encounter: Payer: Self-pay | Admitting: Physician Assistant

## 2020-09-07 ENCOUNTER — Telehealth (INDEPENDENT_AMBULATORY_CARE_PROVIDER_SITE_OTHER): Payer: BC Managed Care – PPO | Admitting: Physician Assistant

## 2020-09-07 VITALS — Temp 98.6°F | Ht 67.0 in | Wt 162.0 lb

## 2020-09-07 DIAGNOSIS — R768 Other specified abnormal immunological findings in serum: Secondary | ICD-10-CM

## 2020-09-07 DIAGNOSIS — M791 Myalgia, unspecified site: Secondary | ICD-10-CM

## 2020-09-07 DIAGNOSIS — Z131 Encounter for screening for diabetes mellitus: Secondary | ICD-10-CM | POA: Diagnosis not present

## 2020-09-07 DIAGNOSIS — M2550 Pain in unspecified joint: Secondary | ICD-10-CM | POA: Diagnosis not present

## 2020-09-07 DIAGNOSIS — R5382 Chronic fatigue, unspecified: Secondary | ICD-10-CM

## 2020-09-07 DIAGNOSIS — Z1159 Encounter for screening for other viral diseases: Secondary | ICD-10-CM

## 2020-09-07 DIAGNOSIS — Z13220 Encounter for screening for lipoid disorders: Secondary | ICD-10-CM

## 2020-09-07 NOTE — Progress Notes (Signed)
Patient ID: Laurie Velasquez, female   DOB: 1981/04/10, 40 y.o.   MRN: [id number] .Virtual Visit via Video Note  I connected with Laurie Velasquez on 09/17/20 at  8:50 AM EDT by a video enabled telemedicine application and verified that I am speaking with the correct person using two identifiers.  Location: Patient: home Provider: clinic  .Marland KitchenParticipating in visit:  Patient: Milisa Provider: Iran Planas PA-C   I discussed the limitations of evaluation and management by telemedicine and the availability of in person appointments. The patient expressed understanding and agreed to proceed.  History of Present Illness: Pt is a 40 yo female who calls into the clinic to discuss ongoing symptoms. For years she has had intermittent waves of myalgias, arthralgias and fatigue. Since January the "episodes" are more frequent and finding herself feeling like she has the flue for 2-3 days. She denies any fever during episodes. She admits she did feel better on a gluten free "next 50 day" diet and she is restarting. She is not as active as she once was. She denies any depression or anxiety. She wants to get up but feels like she cannot. Symptoms are not linked with cycle. She denies any known joint swelling.   .. Active Ambulatory Problems    Diagnosis Date Noted  . Sacroiliac joint dysfunction of both sides 06/11/2014  . GAD (generalized anxiety disorder) 06/17/2014  . Facet hypertrophy of lumbar region 08/28/2014  . Panic attacks 07/06/2015  . Depression 07/06/2015  . Nausea without vomiting 07/06/2015  . Influenza-like illness 08/13/2017  . Trouble in sleeping 01/15/2019  . Anxiety 01/15/2019  . Stress at work 01/15/2019  . Suspected COVID-19 virus infection 06/30/2019  . Teeth grinding 07/01/2019  . Bilateral temporomandibular joint pain 07/01/2019  . TMJ tenderness, right 07/16/2019  . 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 Right ear pain 07/16/2019  . Mastoid pain, right 07/16/2019  . Dizziness 11/09/2019  .  Smoker 03/03/2020  . COVID-19 virus infection 03/03/2020  . Overweight (BMI 25.0-29.9) 03/03/2020  . Seasonal allergies 08/03/2020   Resolved Ambulatory Problems    Diagnosis Date Noted  . Back pain 06/15/2014  . Pregnancy 07/06/2015   No Additional Past Medical History   Reviewed med, allergy, problem list.     Observations/Objective: No acute distress Normal breathing No cough.  Normal appearance and mood.   .. Today's Vitals   09/07/20 0839  Temp: 98.6 F (37 C)  TempSrc: Oral  Weight: 162 lb (73.5 kg)  Height: 5' 7" (1.702 m)   Body mass index is 25.37 kg/m.    Assessment and Plan: Marland KitchenMarland KitchenDiagnoses and all orders for this visit:  Chronic fatigue -     Sed Rate (ESR) -     C-reactive protein -     TSH -     Rheumatoid factor -     Cyclic citrul peptide antibody, IgG -     Antinuclear Antib (ANA) -     CBC with Differential/Platelet -     Thyroid Peroxidase Antibodies (TPO) (REFL) -     B12 and Folate Panel -     VITAMIN D 25 Hydroxy (Vit-D Deficiency, Fractures) -     CMV abs, IgG+IgM (cytomegalovirus) -     Epstein-Barr virus VCA antibody panel -     COMPLETE METABOLIC PANEL WITH GFR -     Lipid Panel w/reflex Direct LDL -     Hepatitis C Antibody  Polyarthralgia -     Sed Rate (ESR) -  C-reactive protein -     TSH -     Rheumatoid factor -     Cyclic citrul peptide antibody, IgG -     Antinuclear Antib (ANA) -     CBC with Differential/Platelet -     Thyroid Peroxidase Antibodies (TPO) (REFL) -     B12 and Folate Panel -     VITAMIN D 25 Hydroxy (Vit-D Deficiency, Fractures) -     CMV abs, IgG+IgM (cytomegalovirus) -     Epstein-Barr virus VCA antibody panel -     COMPLETE METABOLIC PANEL WITH GFR -     Lipid Panel w/reflex Direct LDL -     Hepatitis C Antibody  Screening for diabetes mellitus -     COMPLETE METABOLIC PANEL WITH GFR  Screening for lipid disorders -     Lipid Panel w/reflex Direct LDL  Encounter for hepatitis C screening  test for low risk patient -     Hepatitis C Antibody  Myalgia -     Sed Rate (ESR) -     C-reactive protein -     TSH -     Rheumatoid factor -     Cyclic citrul peptide antibody, IgG -     Antinuclear Antib (ANA) -     CBC with Differential/Platelet -     Thyroid Peroxidase Antibodies (TPO) (REFL) -     B12 and Folate Panel -     VITAMIN D 25 Hydroxy (Vit-D Deficiency, Fractures) -     CMV abs, IgG+IgM (cytomegalovirus) -     Epstein-Barr virus VCA antibody panel -     COMPLETE METABOLIC PANEL WITH GFR -     Lipid Panel w/reflex Direct LDL -     Hepatitis C Antibody  Positive ANA (antinuclear antibody) -     Anti-nuclear ab-titer (ANA titer)   Unclear etiology of symptoms. Years of symptoms. Sounds more like chronic fatigue and fibromyalgia. Will get extensive labs to evaluate.     Follow Up Instructions:    I discussed the assessment and treatment plan with the patient. The patient was provided an opportunity to ask questions and all were answered. The patient agreed with the plan and demonstrated an understanding of the instructions.   The patient was advised to call back or seek an in-person evaluation if the symptoms worsen or if the condition fails to improve as anticipated.   Iran Planas, PA-C

## 2020-09-07 NOTE — Progress Notes (Signed)
PHQ9 (13) -GAD7 (6) completed.   She thinks mood is getting better. Down to one buspar a day. Doesn't currently need refills.

## 2020-09-09 ENCOUNTER — Ambulatory Visit (INDEPENDENT_AMBULATORY_CARE_PROVIDER_SITE_OTHER): Payer: BC Managed Care – PPO

## 2020-09-09 ENCOUNTER — Other Ambulatory Visit: Payer: Self-pay

## 2020-09-09 DIAGNOSIS — Z1231 Encounter for screening mammogram for malignant neoplasm of breast: Secondary | ICD-10-CM | POA: Diagnosis not present

## 2020-09-09 NOTE — Progress Notes (Signed)
Laurie Velasquez,   Sed(inflammation) rate is low.  Thyroid normal.  Negative RF(rheumatoid factor) Normal hemoglobin.  B12 normal.  Vitamin d low. Double what you are taking now.  Glucose, kidney, liver look great.  Cholesterol looks good.    Some labs pending.

## 2020-09-09 NOTE — Progress Notes (Signed)
Normal mammogram. Follow up in 1 year.

## 2020-09-11 LAB — LIPID PANEL W/REFLEX DIRECT LDL
Cholesterol: 177 mg/dL (ref <200)
HDL: 51 mg/dL (ref >=50)
LDL Cholesterol (Calc): 107 mg/dL (calc) — ABNORMAL HIGH
Non-HDL Cholesterol (Calc): 126 mg/dL (calc) (ref <130)
Total CHOL/HDL Ratio: 3.5 (calc) (ref <5.0)
Triglycerides: 99 mg/dL (ref <150)

## 2020-09-11 LAB — CBC WITH DIFFERENTIAL/PLATELET
Absolute Monocytes: 336 cells/uL (ref 200–950)
Basophils Absolute: 47 cells/uL (ref 0–200)
Basophils Relative: 0.8 %
Eosinophils Absolute: 207 cells/uL (ref 15–500)
Eosinophils Relative: 3.5 %
HCT: 43.6 % (ref 35.0–45.0)
Hemoglobin: 14.4 g/dL (ref 11.7–15.5)
Lymphs Abs: 2490 cells/uL (ref 850–3900)
MCH: 30.3 pg (ref 27.0–33.0)
MCHC: 33 g/dL (ref 32.0–36.0)
MCV: 91.8 fL (ref 80.0–100.0)
MPV: 10.8 fL (ref 7.5–12.5)
Monocytes Relative: 5.7 %
Neutro Abs: 2820 cells/uL (ref 1500–7800)
Neutrophils Relative %: 47.8 %
Platelets: 332 10*3/uL (ref 140–400)
RBC: 4.75 10*6/uL (ref 3.80–5.10)
RDW: 12 % (ref 11.0–15.0)
Total Lymphocyte: 42.2 %
WBC: 5.9 10*3/uL (ref 3.8–10.8)

## 2020-09-11 LAB — COMPLETE METABOLIC PANEL WITH GFR
AG Ratio: 1.8 (calc) (ref 1.0–2.5)
ALT: 13 U/L (ref 6–29)
AST: 18 U/L (ref 10–30)
Albumin: 4.7 g/dL (ref 3.6–5.1)
Alkaline phosphatase (APISO): 54 U/L (ref 31–125)
BUN: 14 mg/dL (ref 7–25)
CO2: 27 mmol/L (ref 20–32)
Calcium: 9.6 mg/dL (ref 8.6–10.2)
Chloride: 103 mmol/L (ref 98–110)
Creat: 0.83 mg/dL (ref 0.50–1.10)
GFR, Est African American: 102 mL/min/{1.73_m2} (ref >=60)
GFR, Est Non African American: 88 mL/min/{1.73_m2} (ref >=60)
Globulin: 2.6 g/dL (calc) (ref 1.9–3.7)
Glucose, Bld: 82 mg/dL (ref 65–99)
Potassium: 4.2 mmol/L (ref 3.5–5.3)
Sodium: 138 mmol/L (ref 135–146)
Total Bilirubin: 0.5 mg/dL (ref 0.2–1.2)
Total Protein: 7.3 g/dL (ref 6.1–8.1)

## 2020-09-11 LAB — C-REACTIVE PROTEIN: CRP: 0.4 mg/L (ref <8.0)

## 2020-09-11 LAB — EPSTEIN-BARR VIRUS VCA ANTIBODY PANEL
EBV NA IgG: 282 U/mL — ABNORMAL HIGH
EBV VCA IgG: 687 U/mL — ABNORMAL HIGH
EBV VCA IgM: <36 U/mL

## 2020-09-11 LAB — HEPATITIS C ANTIBODY
Hepatitis C Ab: NONREACTIVE
SIGNAL TO CUT-OFF: 0.03 (ref <1.00)

## 2020-09-11 LAB — CYCLIC CITRUL PEPTIDE ANTIBODY, IGG: Cyclic Citrullin Peptide Ab: <16 UNITS

## 2020-09-11 LAB — ANTI-NUCLEAR AB-TITER (ANA TITER): ANA Titer 1: 1:40 {titer} — ABNORMAL HIGH

## 2020-09-11 LAB — TSH: TSH: 2.3 mIU/L

## 2020-09-11 LAB — CMV ABS, IGG+IGM (CYTOMEGALOVIRUS)
CMV IgM: <30 AU/mL
Cytomegalovirus Ab-IgG: 4.7 U/mL — ABNORMAL HIGH

## 2020-09-11 LAB — VITAMIN D 25 HYDROXY (VIT D DEFICIENCY, FRACTURES): Vit D, 25-Hydroxy: 28 ng/mL — ABNORMAL LOW (ref 30–100)

## 2020-09-11 LAB — ANA: Anti Nuclear Antibody (ANA): POSITIVE — AB

## 2020-09-11 LAB — RHEUMATOID FACTOR: Rheumatoid fact SerPl-aCnc: <14 IU/mL (ref <14)

## 2020-09-11 LAB — B12 AND FOLATE PANEL
Folate: 16.3 ng/mL
Vitamin B-12: 425 pg/mL (ref 200–1100)

## 2020-09-11 LAB — SEDIMENTATION RATE: Sed Rate: 2 mm/h (ref 0–20)

## 2020-09-11 LAB — THYROID PEROXIDASE ANTIBODIES (TPO) (REFL): Thyroperoxidase Ab SerPl-aCnc: <1 IU/mL (ref <9)

## 2020-09-13 NOTE — Progress Notes (Signed)
Laurie Velasquez,  Thyroid antibodies negative.  Your EBV and CMV viruses show no recent infection but antibodies to these viruses.  Your ANA is positive but very LOW titer level. Almost normal. We see elevated ANA in about 30 percent of normal persons. We could still send you to rheumatology for evaluation but not overwhelming sign of autoimmune. Would you like referral?

## 2020-09-17 ENCOUNTER — Encounter: Payer: Self-pay | Admitting: Physician Assistant

## 2020-09-17 DIAGNOSIS — R768 Other specified abnormal immunological findings in serum: Secondary | ICD-10-CM | POA: Insufficient documentation

## 2020-09-17 DIAGNOSIS — M2550 Pain in unspecified joint: Secondary | ICD-10-CM | POA: Insufficient documentation

## 2020-09-17 DIAGNOSIS — R5382 Chronic fatigue, unspecified: Secondary | ICD-10-CM | POA: Insufficient documentation

## 2020-09-17 DIAGNOSIS — M791 Myalgia, unspecified site: Secondary | ICD-10-CM | POA: Insufficient documentation

## 2020-09-28 ENCOUNTER — Other Ambulatory Visit: Payer: Self-pay | Admitting: Physician Assistant

## 2020-09-28 DIAGNOSIS — F458 Other somatoform disorders: Secondary | ICD-10-CM

## 2020-09-28 DIAGNOSIS — M26623 Arthralgia of bilateral temporomandibular joint: Secondary | ICD-10-CM

## 2020-09-28 DIAGNOSIS — F419 Anxiety disorder, unspecified: Secondary | ICD-10-CM

## 2020-09-28 DIAGNOSIS — Z566 Other physical and mental strain related to work: Secondary | ICD-10-CM

## 2020-09-29 NOTE — Telephone Encounter (Signed)
Last written 07/30/2020 #30 with no refills Last appt 09/07/2020

## 2020-11-12 ENCOUNTER — Telehealth: Payer: BC Managed Care – PPO | Admitting: Physician Assistant

## 2020-12-30 ENCOUNTER — Encounter: Payer: Self-pay | Admitting: Physician Assistant

## 2021-01-05 ENCOUNTER — Encounter: Payer: Self-pay | Admitting: Physician Assistant

## 2021-01-05 ENCOUNTER — Ambulatory Visit: Payer: BC Managed Care – PPO | Admitting: Physician Assistant

## 2021-01-05 VITALS — BP 127/79 | HR 74 | Ht 67.0 in | Wt 147.0 lb

## 2021-01-05 DIAGNOSIS — F458 Other somatoform disorders: Secondary | ICD-10-CM | POA: Diagnosis not present

## 2021-01-05 DIAGNOSIS — F410 Panic disorder [episodic paroxysmal anxiety] without agoraphobia: Secondary | ICD-10-CM

## 2021-01-05 DIAGNOSIS — F411 Generalized anxiety disorder: Secondary | ICD-10-CM

## 2021-01-05 DIAGNOSIS — M26623 Arthralgia of bilateral temporomandibular joint: Secondary | ICD-10-CM

## 2021-01-05 DIAGNOSIS — Z566 Other physical and mental strain related to work: Secondary | ICD-10-CM | POA: Diagnosis not present

## 2021-01-05 DIAGNOSIS — G43109 Migraine with aura, not intractable, without status migrainosus: Secondary | ICD-10-CM | POA: Diagnosis not present

## 2021-01-05 DIAGNOSIS — F419 Anxiety disorder, unspecified: Secondary | ICD-10-CM | POA: Diagnosis not present

## 2021-01-05 DIAGNOSIS — F331 Major depressive disorder, recurrent, moderate: Secondary | ICD-10-CM

## 2021-01-05 MED ORDER — BUSPIRONE HCL 7.5 MG PO TABS: 7.5000 mg | ORAL_TABLET | Freq: Two times a day (BID) | ORAL | 1 refills | Status: DC

## 2021-01-05 MED ORDER — NURTEC 75 MG PO TBDP: 1.0000 | ORAL_TABLET | ORAL | 5 refills | Status: DC | PRN

## 2021-01-05 MED ORDER — LORAZEPAM 0.5 MG PO TABS: 0.5000 mg | ORAL_TABLET | Freq: Every day | ORAL | 3 refills | Status: DC

## 2021-01-05 MED ORDER — SERTRALINE HCL 100 MG PO TABS: ORAL_TABLET | ORAL | 1 refills | Status: DC

## 2021-01-05 NOTE — Progress Notes (Signed)
Subjective:    Patient ID: Laurie Velasquez, female    DOB: [birth year], 40 y.o.   MRN: [id number]  HPI Pt is a 40 yo female with Migraines, MDD, Anxiety, Stress, TMJ pain who presents to the clinic for medication refills.   She has done great over the summer with mood and stress. She got an email that school was starting and started getting anxious. She does need all of her medications refilled for her anxiety and depression. She wants to make sure she is starting those back.   She has done really well with weight loss and feels better this summer. She used optivia to help jump start the weight loss and then keeping it off with her own diet and exercise.   Her migraines have worsened some. She seems to be getting them more around her period and just right before. She also noticed a link to migraine and being really stressed or tired. Tried maxalt for rescue and does not like the way she feels after she takes it. She would like to try something else. She is having about 3-6 migraine days a month. She does have aura at times.   .. 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     Objective:   Physical Exam Vitals reviewed.  Constitutional:      Appearance: Normal appearance.  HENT:     Head: Normocephalic.  Cardiovascular:     Rate and Rhythm: Normal rate and regular rhythm.     Pulses: Normal pulses.     Heart sounds: Normal heart sounds.  Pulmonary:     Effort: Pulmonary effort is normal.  Neurological:     General: No focal deficit present.     Mental Status: She is alert and oriented to person, place, and time.  Psychiatric:        Mood and Affect: Mood normal.         .. Depression screen [facility] 2/9 01/05/2021 09/07/2020 06/11/2020 03/30/2020 11/05/2019  Decreased Interest 0 1 1 3  0  Down, Depressed, Hopeless 0 0 1 3 0  PHQ - 2 Score 0 1 2 6  0  Altered sleeping 3 3 0 3 3  Tired, decreased energy 2 3 1 3 3   Change in appetite 1 3 0 3 3  Feeling bad or failure about yourself  1 0 1 3 0  Trouble concentrating 1 2 0 3 0  Moving slowly or fidgety/restless 2 1 0 3 0  Suicidal thoughts 0 0 0 0 0  PHQ-9 Score 10 13 4 24 9   Difficult doing work/chores Somewhat difficult Very difficult Somewhat difficult Extremely dIfficult Somewhat difficult  Some recent data might be hidden   . GAD 7 : Generalized Anxiety Score 01/05/2021 09/07/2020 06/11/2020 03/30/2020  Nervous, Anxious, on Edge 1 1 1 3   Control/stop worrying 1 1 1 3   Worry too much - different things 1 1 1 3   Trouble relaxing 0 0 1 0  Restless 0 0 1 0  Easily annoyed or irritable 2 2 1 3   Afraid - awful might happen 1 1 1 3   Total GAD 7 Score 6 6 7 15   Anxiety Difficulty Somewhat difficult Very difficult Not difficult at all Extremely difficult      Assessment & Plan:  13/06/2019 Laurie Velasquez was seen today for follow-up.  Diagnoses and all orders for this visit:  Migraine with aura and without status migrainosus, not intractable -     Rimegepant Sulfate (NURTEC) 75 MG TBDP; Take 1 tablet by mouth as needed.  Stress at work -     LORazepam (ATIVAN) 0.5 MG tablet; Take 1 tablet (0.5 mg total) by mouth at bedtime.  Anxiety -     LORazepam (ATIVAN) 0.5 MG tablet; Take 1 tablet (0.5 mg total) by mouth at bedtime.  Teeth grinding -     LORazepam (ATIVAN) 0.5 MG tablet; Take 1 tablet (0.5 mg total) by mouth at bedtime.  Bilateral temporomandibular joint pain -     LORazepam (ATIVAN) 0.5 MG tablet; Take 1 tablet (0.5 mg total) by mouth at bedtime.  Moderate episode of recurrent major depressive disorder (HCC) -     sertraline (ZOLOFT) 100 MG tablet; TAKE 1 TABLET BY MOUTH DAILY  Panic attacks -     busPIRone (BUSPAR) 7.5 MG tablet; Take 1 tablet (7.5 mg total) by mouth 2 (two) times daily.  GAD (generalized anxiety disorder) -     busPIRone (BUSPAR) 7.5 MG tablet; Take 1 tablet (7.5 mg total) by mouth 2 (two) times daily.  Refilled anxiety/mood medications.  Use xanax sparingly.  Discuss stress relief techniques.  Follow up as needed.   Stop maxalt try nurtec for rescue.  Discussed possible need for FMLA for intermittent absences due to migraines.  Bring paperwork by for 3-6 days a month.  Hopefully nurtec will help with rescue. Discussed taking nurtec around suspected hormone headache to prevent.  Follow up in 3 months.

## 2021-02-11 ENCOUNTER — Encounter: Payer: Self-pay | Admitting: Physician Assistant

## 2021-02-11 DIAGNOSIS — R5381 Other malaise: Secondary | ICD-10-CM

## 2021-02-11 DIAGNOSIS — M6281 Muscle weakness (generalized): Secondary | ICD-10-CM

## 2021-02-14 ENCOUNTER — Telehealth: Payer: Self-pay

## 2021-02-14 NOTE — Telephone Encounter (Signed)
Medication: Rimegepant Sulfate (NURTEC) 75 MG TBDP Prior authorization submitted via CoverMyMeds on 02/14/2021 PA submission pending

## 2021-02-22 NOTE — Telephone Encounter (Signed)
Medication: Rimegepant Sulfate (NURTEC) 75 MG TBDP Prior authorization determination received Medication has been approved Approval dates: 02/15/2021-02/15/2022  Patient aware via: MyChart Pharmacy aware: Yes Provider aware via this encounter

## 2021-07-20 ENCOUNTER — Other Ambulatory Visit: Payer: Self-pay | Admitting: Physician Assistant

## 2021-07-20 DIAGNOSIS — F458 Other somatoform disorders: Secondary | ICD-10-CM

## 2021-07-20 DIAGNOSIS — M26623 Arthralgia of bilateral temporomandibular joint: Secondary | ICD-10-CM

## 2021-07-20 DIAGNOSIS — Z566 Other physical and mental strain related to work: Secondary | ICD-10-CM

## 2021-07-20 DIAGNOSIS — F419 Anxiety disorder, unspecified: Secondary | ICD-10-CM

## 2021-07-20 DIAGNOSIS — F331 Major depressive disorder, recurrent, moderate: Secondary | ICD-10-CM

## 2021-07-20 NOTE — Telephone Encounter (Signed)
Last appt Aug 2022 Last written 01/05/2021 #30 with 3 rfs

## 2021-08-31 ENCOUNTER — Encounter: Payer: Self-pay | Admitting: Physician Assistant

## 2021-08-31 ENCOUNTER — Ambulatory Visit: Payer: BC Managed Care – PPO | Admitting: Physician Assistant

## 2021-08-31 VITALS — BP 119/79 | HR 83 | Resp 16 | Ht 67.0 in | Wt 161.0 lb

## 2021-08-31 DIAGNOSIS — R110 Nausea: Secondary | ICD-10-CM

## 2021-08-31 DIAGNOSIS — G43109 Migraine with aura, not intractable, without status migrainosus: Secondary | ICD-10-CM | POA: Diagnosis not present

## 2021-08-31 MED ORDER — KETOROLAC TROMETHAMINE 60 MG/2ML IM SOLN
60.0000 mg | Freq: Once | INTRAMUSCULAR | Status: AC
Start: 2021-08-31 — End: 2021-08-31
  Administered 2021-08-31: 60 mg via INTRAMUSCULAR

## 2021-08-31 MED ORDER — DEXAMETHASONE SODIUM PHOSPHATE 10 MG/ML IJ SOLN
10.0000 mg | Freq: Once | INTRAMUSCULAR | Status: AC
  Administered 2021-08-31: 10 mg via INTRAMUSCULAR

## 2021-08-31 MED ORDER — NURTEC 75 MG PO TBDP: 1.0000 | ORAL_TABLET | ORAL | 5 refills | Status: DC

## 2021-08-31 MED ORDER — PROMETHAZINE HCL 25 MG PO TABS: 25.0000 mg | ORAL_TABLET | Freq: Four times a day (QID) | ORAL | 0 refills | Status: DC | PRN

## 2021-08-31 NOTE — Progress Notes (Signed)
? ?  Subjective:  ? ? Patient ID: Laurie Velasquez, female    DOB: 09-Nov-1980, 41 y.o.   MRN: [id number] ? ?HPI ?Pt is a 41 yo female who presents to the clinic with migraine waxing and waning for 5 days. She had a tension headache Thursday and took tylenol and helped. Friday night started with migraine. Nurtec seemed to take edge off but kept coming back. She has a migraine app and stated barometric pressure was high. Nurtec has been helping a lot with migraine prevention/rescue. She is only taking prn.  ? ?.. ?Active Ambulatory Problems  ?  Diagnosis Date Noted  ? Sacroiliac joint dysfunction of both sides 06/11/2014  ? GAD (generalized anxiety disorder) 06/17/2014  ? Facet hypertrophy of lumbar region 08/28/2014  ? Panic attacks 07/06/2015  ? Depression 07/06/2015  ? Nausea without vomiting 07/06/2015  ? Influenza-like illness 08/13/2017  ? Trouble in sleeping 01/15/2019  ? Anxiety 01/15/2019  ? Stress at work 01/15/2019  ? Suspected COVID-19 virus infection 06/30/2019  ? Teeth grinding 07/01/2019  ? Bilateral temporomandibular joint pain 07/01/2019  ? TMJ tenderness, right 07/16/2019  ? 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 Right ear pain 07/16/2019  ? Mastoid pain, right 07/16/2019  ? Dizziness 11/09/2019  ? Smoker 03/03/2020  ? COVID-19 virus infection 03/03/2020  ? Overweight (BMI 25.0-29.9) 03/03/2020  ? Seasonal allergies 08/03/2020  ? Myalgia 09/17/2020  ? Positive ANA (antinuclear antibody) 09/17/2020  ? Polyarthralgia 09/17/2020  ? Chronic fatigue 09/17/2020  ? Migraine with aura and without status migrainosus, not intractable 01/05/2021  ? Nausea 09/01/2021  ? ?Resolved Ambulatory Problems  ?  Diagnosis Date Noted  ? Back pain 06/15/2014  ? Pregnancy 07/06/2015  ? ?No Additional Past Medical History  ? ? ? ? ?Review of Systems ?See HPI.  ?   ?Objective:  ? Physical Exam ?Vitals reviewed.  ?Constitutional:   ?   Appearance: Normal appearance.  ?HENT:  ?   Head: Normocephalic.  ?Cardiovascular:  ?   Rate and Rhythm: Normal  rate.  ?Pulmonary:  ?   Effort: Pulmonary effort is normal.  ?Neurological:  ?   General: No focal deficit present.  ?   Mental Status: She is alert and oriented to person, place, and time.  ?Psychiatric:     ?   Mood and Affect: Mood normal.  ? ? ? ? ? ?   ?Assessment & Plan:  ?..Laurie Velasquez was seen today for migraine. ? ?Diagnoses and all orders for this visit: ? ?Migraine with aura and without status migrainosus, not intractable ?-     promethazine (PHENERGAN) 25 MG tablet; Take 1 tablet (25 mg total) by mouth every 6 (six) hours as needed for nausea or vomiting. ?-     Rimegepant Sulfate (NURTEC) 75 MG TBDP; Take 1 tablet by mouth every other day. ?-     ketorolac (TORADOL) injection 60 mg ?-     dexamethasone (DECADRON) injection 10 mg ? ?Nausea ?-     promethazine (PHENERGAN) 25 MG tablet; Take 1 tablet (25 mg total) by mouth every 6 (six) hours as needed for nausea or vomiting. ? ? ?Migraine cocktail given today.  ?Phenergan for nausea.  ?Refilled Nurtec for preventative every other day.  ?Discussed trigger with barometric pressure and app and how to prevent.  ? ?

## 2021-08-31 NOTE — Patient Instructions (Signed)
Migraine Headache A migraine headache is an intense, throbbing pain on one side or both sides of the head. Migraine headaches may also cause other symptoms, such as nausea, vomiting, and sensitivity to light and noise. A migraine headache can last from 4 hours to 3 days. Talk with your doctor about what things may bring on (trigger) your migraine headaches. What are the causes? The exact cause of this condition is not known. However, a migraine may be caused when nerves in the brain become irritated and release chemicals that cause inflammation of blood vessels. This inflammation causes pain. This condition may be triggered or caused by: Drinking alcohol. Smoking. Taking medicines, such as: Medicine used to treat chest pain (nitroglycerin). Birth control pills. Estrogen. Certain blood pressure medicines. Eating or drinking products that contain nitrates, glutamate, aspartame, or tyramine. Aged cheeses, chocolate, or caffeine may also be triggers. Doing physical activity. Other things that may trigger a migraine headache include: Menstruation. Pregnancy. Hunger. Stress. Lack of sleep or too much sleep. Weather changes. Fatigue. What increases the risk? The following factors may make you more likely to experience migraine headaches: Being a certain age. This condition is more common in people who are 25-55 years old. Being female. Having a family history of migraine headaches. Being Caucasian. Having a mental health condition, such as depression or anxiety. Being obese. What are the signs or symptoms? The main symptom of this condition is pulsating or throbbing pain. This pain may: Happen in any area of the head, such as on one side or both sides. Interfere with daily activities. Get worse with physical activity. Get worse with exposure to bright lights or loud noises. Other symptoms may include: Nausea. Vomiting. Dizziness. General sensitivity to bright lights, loud noises, or  smells. Before you get a migraine headache, you may get warning signs (an aura). An aura may include: Seeing flashing lights or having blind spots. Seeing bright spots, halos, or zigzag lines. Having tunnel vision or blurred vision. Having numbness or a tingling feeling. Having trouble talking. Having muscle weakness. Some people have symptoms after a migraine headache (postdromal phase), such as: Feeling tired. Difficulty concentrating. How is this diagnosed? A migraine headache can be diagnosed based on: Your symptoms. A physical exam. Tests, such as: CT scan or an MRI of the head. These imaging tests can help rule out other causes of headaches. Taking fluid from the spine (lumbar puncture) and analyzing it (cerebrospinal fluid analysis, or CSF analysis). How is this treated? This condition may be treated with medicines that: Relieve pain. Relieve nausea. Prevent migraine headaches. Treatment for this condition may also include: Acupuncture. Lifestyle changes like avoiding foods that trigger migraine headaches. Biofeedback. Cognitive behavioral therapy. Follow these instructions at home: Medicines Take over-the-counter and prescription medicines only as told by your health care provider. Ask your health care provider if the medicine prescribed to you: Requires you to avoid driving or using heavy machinery. Can cause constipation. You may need to take these actions to prevent or treat constipation: Drink enough fluid to keep your urine pale yellow. Take over-the-counter or prescription medicines. Eat foods that are high in fiber, such as beans, whole grains, and fresh fruits and vegetables. Limit foods that are high in fat and processed sugars, such as fried or sweet foods. Lifestyle Do not drink alcohol. Do not use any products that contain nicotine or tobacco, such as cigarettes, e-cigarettes, and chewing tobacco. If you need help quitting, ask your health care  provider. Get at least 8   hours of sleep every night. Find ways to manage stress, such as meditation, deep breathing, or yoga. General instructions   Keep a journal to find out what may trigger your migraine headaches. For example, write down: What you eat and drink. How much sleep you get. Any change to your diet or medicines. If you have a migraine headache: Avoid things that make your symptoms worse, such as bright lights. It may help to lie down in a dark, quiet room. Do not drive or use heavy machinery. Ask your health care provider what activities are safe for you while you are experiencing symptoms. Keep all follow-up visits as told by your health care provider. This is important. Contact a health care provider if: You develop symptoms that are different or more severe than your usual migraine headache symptoms. You have more than 15 headache days in one month. Get help right away if: Your migraine headache becomes severe. Your migraine headache lasts longer than 72 hours. You have a fever. You have a stiff neck. You have vision loss. Your muscles feel weak or like you cannot control them. You start to lose your balance often. You have trouble walking. You faint. You have a seizure. Summary A migraine headache is an intense, throbbing pain on one side or both sides of the head. Migraines may also cause other symptoms, such as nausea, vomiting, and sensitivity to light and noise. This condition may be treated with medicines and lifestyle changes. You may also need to avoid certain things that trigger a migraine headache. Keep a journal to find out what may trigger your migraine headaches. Contact your health care provider if you have more than 15 headache days in a month or you develop symptoms that are different or more severe than your usual migraine headache symptoms. This information is not intended to replace advice given to you by your health care provider. Make sure you  discuss any questions you have with your health care provider. Document Revised: 09/06/2018 Document Reviewed: 06/27/2018 Elsevier Patient Education  2022 [employer] Inc[employer]  

## 2021-09-01 ENCOUNTER — Encounter: Payer: Self-pay | Admitting: Physician Assistant

## 2021-09-01 DIAGNOSIS — R110 Nausea: Secondary | ICD-10-CM | POA: Insufficient documentation

## 2021-09-08 ENCOUNTER — Other Ambulatory Visit: Payer: Self-pay | Admitting: Physician Assistant

## 2021-09-08 DIAGNOSIS — Z566 Other physical and mental strain related to work: Secondary | ICD-10-CM

## 2021-09-08 DIAGNOSIS — M26623 Arthralgia of bilateral temporomandibular joint: Secondary | ICD-10-CM

## 2021-09-08 DIAGNOSIS — F458 Other somatoform disorders: Secondary | ICD-10-CM

## 2021-09-08 DIAGNOSIS — F419 Anxiety disorder, unspecified: Secondary | ICD-10-CM

## 2021-09-08 NOTE — Telephone Encounter (Signed)
Last appt 08/31/2021  ?Last written 07/20/2021 #30 no refills ?

## 2021-09-20 ENCOUNTER — Encounter: Payer: Self-pay | Admitting: Physician Assistant

## 2021-09-22 NOTE — Telephone Encounter (Signed)
Forms completed, waiting on signature from [place].  ?

## 2021-09-23 NOTE — Telephone Encounter (Signed)
Forms completed and given to front desk for payment. Copy to hold and to scan.  ?

## 2021-10-12 ENCOUNTER — Encounter: Payer: Self-pay | Admitting: Physician Assistant

## 2021-10-12 DIAGNOSIS — M26622 Arthralgia of left temporomandibular joint: Secondary | ICD-10-CM

## 2021-10-12 DIAGNOSIS — H9202 Otalgia, left ear: Secondary | ICD-10-CM

## 2021-10-12 DIAGNOSIS — M26629 Arthralgia of temporomandibular joint, unspecified side: Secondary | ICD-10-CM

## 2021-10-12 MED ORDER — BACLOFEN 10 MG PO TABS: 10.0000 mg | ORAL_TABLET | Freq: Every day | ORAL | 0 refills | Status: DC

## 2021-10-12 NOTE — Telephone Encounter (Signed)
Last written 02/19/2020 #30 with 2 refills ?Please advise if okay to refill ?

## 2021-10-29 ENCOUNTER — Other Ambulatory Visit: Payer: Self-pay | Admitting: Physician Assistant

## 2021-10-29 DIAGNOSIS — F331 Major depressive disorder, recurrent, moderate: Secondary | ICD-10-CM

## 2021-11-01 ENCOUNTER — Encounter: Payer: Self-pay | Admitting: Physician Assistant

## 2022-01-10 ENCOUNTER — Telehealth: Payer: BC Managed Care – PPO | Admitting: Physician Assistant

## 2022-01-10 VITALS — Ht 67.0 in | Wt 160.0 lb

## 2022-01-10 DIAGNOSIS — Z566 Other physical and mental strain related to work: Secondary | ICD-10-CM | POA: Diagnosis not present

## 2022-01-10 DIAGNOSIS — M26629 Arthralgia of temporomandibular joint, unspecified side: Secondary | ICD-10-CM

## 2022-01-10 DIAGNOSIS — M26622 Arthralgia of left temporomandibular joint: Secondary | ICD-10-CM | POA: Diagnosis not present

## 2022-01-10 DIAGNOSIS — N938 Other specified abnormal uterine and vaginal bleeding: Secondary | ICD-10-CM | POA: Diagnosis not present

## 2022-01-10 DIAGNOSIS — H9202 Otalgia, left ear: Secondary | ICD-10-CM

## 2022-01-10 DIAGNOSIS — G43109 Migraine with aura, not intractable, without status migrainosus: Secondary | ICD-10-CM

## 2022-01-10 DIAGNOSIS — R61 Generalized hyperhidrosis: Secondary | ICD-10-CM

## 2022-01-10 DIAGNOSIS — F331 Major depressive disorder, recurrent, moderate: Secondary | ICD-10-CM

## 2022-01-10 DIAGNOSIS — F418 Other specified anxiety disorders: Secondary | ICD-10-CM

## 2022-01-10 DIAGNOSIS — Z1231 Encounter for screening mammogram for malignant neoplasm of breast: Secondary | ICD-10-CM

## 2022-01-10 DIAGNOSIS — R4586 Emotional lability: Secondary | ICD-10-CM

## 2022-01-10 DIAGNOSIS — F419 Anxiety disorder, unspecified: Secondary | ICD-10-CM

## 2022-01-10 DIAGNOSIS — F458 Other somatoform disorders: Secondary | ICD-10-CM

## 2022-01-10 DIAGNOSIS — M26623 Arthralgia of bilateral temporomandibular joint: Secondary | ICD-10-CM

## 2022-01-10 MED ORDER — LORAZEPAM 0.5 MG PO TABS: ORAL_TABLET | ORAL | 2 refills | Status: DC

## 2022-01-10 MED ORDER — SERTRALINE HCL 100 MG PO TABS: ORAL_TABLET | ORAL | 3 refills | Status: DC

## 2022-01-10 MED ORDER — NURTEC 75 MG PO TBDP: 1.0000 | ORAL_TABLET | ORAL | 5 refills | Status: DC

## 2022-01-10 MED ORDER — DICLOFENAC SODIUM 75 MG PO TBEC: 75.0000 mg | DELAYED_RELEASE_TABLET | Freq: Two times a day (BID) | ORAL | 3 refills | Status: DC

## 2022-01-10 MED ORDER — BACLOFEN 10 MG PO TABS: 10.0000 mg | ORAL_TABLET | Freq: Every day | ORAL | 1 refills | Status: DC

## 2022-01-10 NOTE — Progress Notes (Unsigned)
..Virtual Visit via Video Note  I connected with Laurie Velasquez on 01/10/22 at 11:30 AM EDT by a video enabled telemedicine application and verified that I am speaking with the correct person using two identifiers.  Location: Patient: home Provider: clinic  .Marland KitchenParticipating in visit:  Patient: Laurie Velasquez Provider: Tandy Gaw PA-C   I discussed the limitations of evaluation and management by telemedicine and the availability of in person appointments. The patient expressed understanding and agreed to proceed.  History of Present Illness: Pt is a 40 yo female with Migraines, TMJ, GAD, Stress at work who needs refills.   Migraines/TMJ/arthritic pain has improved and stable.   She is having some menstrual changes. Her periods are heavier and more painful than they have been. For the most part they are regular but the timing has also been off some. She wonders about peri-menopause. She has been waking up in night sweats, she has been having mood swings.     Observations/Objective: No acute distress Normal mood and appearance Normal breathing  .Marland Kitchen Today's Vitals   01/10/22 1112  Weight: 160 lb (72.6 kg)  Height: 5\' 7"  (1.702 m)   Body mass index is 25.06 kg/m.  ..    01/10/2022   11:13 AM 08/31/2021    4:06 PM 01/05/2021    9:54 AM 09/07/2020    8:36 AM 06/11/2020    7:24 AM  Depression screen PHQ 2/9  Decreased Interest 1 0 0 1 1  Down, Depressed, Hopeless 0 0 0 0 1  PHQ - 2 Score 1 0 0 1 2  Altered sleeping 3 2 3 3  0  Tired, decreased energy 3 2 2 3 1   Change in appetite 3 2 1 3  0  Feeling bad or failure about yourself  0 0 1 0 1  Trouble concentrating 1 2 1 2  0  Moving slowly or fidgety/restless 0 1 2 1  0  Suicidal thoughts 0 0 0 0 0  PHQ-9 Score 11 9 10 13 4   Difficult doing work/chores Somewhat difficult Somewhat difficult Somewhat difficult Very difficult Somewhat difficult   .4/9    01/10/2022   11:15 AM 08/31/2021    4:07 PM 01/05/2021    9:55 AM 09/07/2020    8:37 AM   GAD 7 : Generalized Anxiety Score  Nervous, Anxious, on Edge 1 1 1 1   Control/stop worrying 1 1 1 1   Worry too much - different things 1 1 1 1   Trouble relaxing 0 0 0 0  Restless 0 0 0 0  Easily annoyed or irritable 3 1 2 2   Afraid - awful might happen 1 1 1 1   Total GAD 7 Score 7 5 6 6   Anxiety Difficulty Somewhat difficult Somewhat difficult Somewhat difficult Very difficult       Assessment and Plan:  Laurie Velasquez was seen today for follow-up, depression and anxiety.  Diagnoses and all orders for this visit:  DUB (dysfunctional uterine bleeding) -     Estradiol -     Progesterone -     FSH/LH -     CBC w/Diff/Platelet -     COMPLETE METABOLIC PANEL WITH GFR -     01/12/2022 Pelvic Complete With Transvaginal; Future  TMJ tenderness, left -     baclofen (LIORESAL) 10 MG tablet; Take 1 tablet (10 mg total) by mouth at bedtime.  TMJ syndrome -     baclofen (LIORESAL) 10 MG tablet; Take 1 tablet (10 mg total) by mouth at bedtime.  Left ear pain -  baclofen (LIORESAL) 10 MG tablet; Take 1 tablet (10 mg total) by mouth at bedtime.  Stress at work -     LORazepam (ATIVAN) 0.5 MG tablet; TAKE 1 TABLET(0.5 MG) BY MOUTH AT BEDTIME  Anxiety -     LORazepam (ATIVAN) 0.5 MG tablet; TAKE 1 TABLET(0.5 MG) BY MOUTH AT BEDTIME  Teeth grinding -     LORazepam (ATIVAN) 0.5 MG tablet; TAKE 1 TABLET(0.5 MG) BY MOUTH AT BEDTIME  Bilateral temporomandibular joint pain -     LORazepam (ATIVAN) 0.5 MG tablet; TAKE 1 TABLET(0.5 MG) BY MOUTH AT BEDTIME  Moderate episode of recurrent major depressive disorder (HCC) -     sertraline (ZOLOFT) 100 MG tablet; TAKE 1 TABLET BY MOUTH DAILY  Migraine with aura and without status migrainosus, not intractable -     Rimegepant Sulfate (NURTEC) 75 MG TBDP; Take 1 tablet by mouth every other day.  Visit for screening mammogram -     MM 3D SCREEN BREAST BILATERAL; Future  Other orders -     diclofenac (VOLTAREN) 75 MG EC tablet; Take 1 tablet (75 mg total)  by mouth 2 (two) times daily.   Refilled medication Labs ordered to look into hormone status Pt would consider birth control to help with symptoms Ordered pelvic u/s    Follow Up Instructions:    I discussed the assessment and treatment plan with the patient. The patient was provided an opportunity to ask questions and all were answered. The patient agreed with the plan and demonstrated an understanding of the instructions.   The patient was advised to call back or seek an in-person evaluation if the symptoms worsen or if the condition fails to improve as anticipated.   Tandy Gaw, PA-C

## 2022-01-10 NOTE — Progress Notes (Unsigned)
PHQ9 (11) -GAD7 (7) completed.   Need refills

## 2022-01-11 ENCOUNTER — Encounter: Payer: Self-pay | Admitting: Physician Assistant

## 2022-01-11 DIAGNOSIS — N938 Other specified abnormal uterine and vaginal bleeding: Secondary | ICD-10-CM | POA: Insufficient documentation

## 2022-01-11 DIAGNOSIS — R61 Generalized hyperhidrosis: Secondary | ICD-10-CM | POA: Insufficient documentation

## 2022-01-11 DIAGNOSIS — R4586 Emotional lability: Secondary | ICD-10-CM | POA: Insufficient documentation

## 2022-02-04 ENCOUNTER — Telehealth: Payer: Self-pay

## 2022-02-04 NOTE — Telephone Encounter (Addendum)
Initiated Prior authorization [id number]:MCEYEM 75MG  dispersible tablets Via: Covermymeds Case/Key:[id number] Status: approved  as of 02/04/22 Reason:, this request is approved for the following time period: 02/04/2022 - 02/05/2023 Notified Pt via: Mychart

## 2022-02-09 ENCOUNTER — Other Ambulatory Visit: Payer: Self-pay | Admitting: Physician Assistant

## 2022-02-09 DIAGNOSIS — F331 Major depressive disorder, recurrent, moderate: Secondary | ICD-10-CM

## 2022-03-09 ENCOUNTER — Ambulatory Visit (INDEPENDENT_AMBULATORY_CARE_PROVIDER_SITE_OTHER): Payer: BC Managed Care – PPO

## 2022-03-09 DIAGNOSIS — N938 Other specified abnormal uterine and vaginal bleeding: Secondary | ICD-10-CM

## 2022-03-09 DIAGNOSIS — Z1231 Encounter for screening mammogram for malignant neoplasm of breast: Secondary | ICD-10-CM

## 2022-03-10 ENCOUNTER — Encounter: Payer: Self-pay | Admitting: Physician Assistant

## 2022-03-10 LAB — CBC WITH DIFFERENTIAL/PLATELET
Absolute Monocytes: 338 cells/uL (ref 200–950)
Basophils Absolute: 30 cells/uL (ref 0–200)
Basophils Relative: 0.4 %
Eosinophils Absolute: 203 cells/uL (ref 15–500)
Eosinophils Relative: 2.7 %
HCT: 37.5 % (ref 35.0–45.0)
Hemoglobin: 13.2 g/dL (ref 11.7–15.5)
Lymphs Abs: 2325 cells/uL (ref 850–3900)
MCH: 32.4 pg (ref 27.0–33.0)
MCHC: 35.2 g/dL (ref 32.0–36.0)
MCV: 91.9 fL (ref 80.0–100.0)
MPV: 11.1 fL (ref 7.5–12.5)
Monocytes Relative: 4.5 %
Neutro Abs: 4605 cells/uL (ref 1500–7800)
Neutrophils Relative %: 61.4 %
Platelets: 321 10*3/uL (ref 140–400)
RBC: 4.08 10*6/uL (ref 3.80–5.10)
RDW: 11.5 % (ref 11.0–15.0)
Total Lymphocyte: 31 %
WBC: 7.5 10*3/uL (ref 3.8–10.8)

## 2022-03-10 LAB — COMPLETE METABOLIC PANEL WITH GFR
AG Ratio: 1.8 (calc) (ref 1.0–2.5)
ALT: 13 U/L (ref 6–29)
AST: 18 U/L (ref 10–30)
Albumin: 4.4 g/dL (ref 3.6–5.1)
Alkaline phosphatase (APISO): 55 U/L (ref 31–125)
BUN/Creatinine Ratio: 15 (calc) (ref 6–22)
BUN: 21 mg/dL (ref 7–25)
CO2: 28 mmol/L (ref 20–32)
Calcium: 9 mg/dL (ref 8.6–10.2)
Chloride: 105 mmol/L (ref 98–110)
Creat: 1.41 mg/dL — ABNORMAL HIGH (ref 0.50–0.99)
Globulin: 2.5 g/dL (calc) (ref 1.9–3.7)
Glucose, Bld: 81 mg/dL (ref 65–99)
Potassium: 4 mmol/L (ref 3.5–5.3)
Sodium: 138 mmol/L (ref 135–146)
Total Bilirubin: 0.4 mg/dL (ref 0.2–1.2)
Total Protein: 6.9 g/dL (ref 6.1–8.1)
eGFR: 48 mL/min/{1.73_m2} — ABNORMAL LOW (ref >=60)

## 2022-03-10 LAB — FSH/LH
FSH: 6.6 m[IU]/mL
LH: 13.4 m[IU]/mL

## 2022-03-10 LAB — PROGESTERONE: Progesterone: <0.5 ng/mL

## 2022-03-10 LAB — ESTRADIOL: Estradiol: 152 pg/mL

## 2022-03-11 NOTE — Progress Notes (Signed)
Please call patient. Normal mammogram.  Repeat in 1 year.  

## 2022-03-11 NOTE — Progress Notes (Signed)
HI Laurie Velasquez, you do have thickened lining of the uterus and what looks like pelvic congestion which can cause some increases discomfort in the pelvic area. We need to get you in with GYN to discuss options and further treatment if needed.  Let us know if you have a preference for provider or location.

## 2022-03-13 ENCOUNTER — Encounter: Payer: Self-pay | Admitting: Physician Assistant

## 2022-03-13 NOTE — Telephone Encounter (Signed)
Answered on MyChart note.  Patient had sent message before I had had a chance to review her results.

## 2022-03-13 NOTE — Progress Notes (Signed)
Your kidney function has dropped some from last year. At times NSAIDs can do that or dehydration. Recheck in 1 week with good hydration and see if continues to be low.

## 2022-03-14 ENCOUNTER — Other Ambulatory Visit: Payer: Self-pay | Admitting: Neurology

## 2022-03-14 DIAGNOSIS — N289 Disorder of kidney and ureter, unspecified: Secondary | ICD-10-CM

## 2022-03-22 LAB — COMPLETE METABOLIC PANEL WITH GFR
AG Ratio: 1.8 (calc) (ref 1.0–2.5)
ALT: 12 U/L (ref 6–29)
AST: 17 U/L (ref 10–30)
Albumin: 4.5 g/dL (ref 3.6–5.1)
Alkaline phosphatase (APISO): 55 U/L (ref 31–125)
BUN: 14 mg/dL (ref 7–25)
CO2: 27 mmol/L (ref 20–32)
Calcium: 9.4 mg/dL (ref 8.6–10.2)
Chloride: 106 mmol/L (ref 98–110)
Creat: 0.83 mg/dL (ref 0.50–0.99)
Globulin: 2.5 g/dL (calc) (ref 1.9–3.7)
Glucose, Bld: 91 mg/dL (ref 65–99)
Potassium: 4.3 mmol/L (ref 3.5–5.3)
Sodium: 140 mmol/L (ref 135–146)
Total Bilirubin: 0.4 mg/dL (ref 0.2–1.2)
Total Protein: 7 g/dL (ref 6.1–8.1)
eGFR: 90 mL/min/{1.73_m2} (ref >=60)

## 2022-03-22 NOTE — Progress Notes (Signed)
MUCH better. GFR is 90!

## 2022-04-06 ENCOUNTER — Ambulatory Visit: Payer: BC Managed Care – PPO | Admitting: Obstetrics & Gynecology

## 2022-04-06 ENCOUNTER — Encounter: Payer: Self-pay | Admitting: Obstetrics & Gynecology

## 2022-04-06 VITALS — BP 123/77 | HR 77 | Resp 16 | Ht 67.0 in | Wt 167.0 lb

## 2022-04-06 DIAGNOSIS — N938 Other specified abnormal uterine and vaginal bleeding: Secondary | ICD-10-CM

## 2022-04-06 DIAGNOSIS — Z712 Person consulting for explanation of examination or test findings: Secondary | ICD-10-CM | POA: Diagnosis not present

## 2022-04-06 NOTE — Patient Instructions (Signed)
Progesterone oral therapy for abnormal uterine bleeding (Provera, Megestrol, Slynd birth control, Norethindrone)

## 2022-04-06 NOTE — Progress Notes (Signed)
GYNECOLOGY OFFICE VISIT NOTE  History:   Laurie Velasquez is a 41 y.o. G3P2 here today for discussion of recent ultrasound results.  Pelvic ultrasound ordered by PCP as part of AUB evaluation, patient reports history of AUB for months. Periods are heavier and more painful.  Also has worsening migraines, has aura with her migraines and followed by Neurology.  Ultrasound showed 9 mm endometrial stripe, nonspecific prominent left adnexal vessels.  No history of chronic pelvic pain. She denies any current abnormal vaginal discharge, bleeding, pelvic pain or other concerns.    Past Medical History:  Diagnosis Date   Anxiety    Arthritis    Migraine with aura    Vaginal Pap smear, abnormal     Past Surgical History:  Procedure Laterality Date   CESAREAN SECTION     DILATION AND CURETTAGE OF UTERUS     LEEP     TUBAL LIGATION     The following portions of the patient's history were reviewed and updated as appropriate: allergies, current medications, past family history, past medical history, past social history, past surgical history and problem list.   Health Maintenance:  Normal pap and negative HRHPV on 06/04/2020.  Normal mammogram on 03/09/2022.   Review of Systems:  Pertinent items noted in HPI and remainder of comprehensive ROS otherwise negative.  Physical Exam:  BP 123/77   Pulse 77   Resp 16   Ht 5\' 7"  (1.702 m)   Wt 167 lb (75.8 kg)   LMP 03/26/2022   BMI 26.16 kg/m  CONSTITUTIONAL: Well-developed, well-nourished female in no acute distress.  HEENT:  Normocephalic, atraumatic. External right and left ear normal. No scleral icterus.  NECK: Normal range of motion, supple, no masses noted on observation SKIN: No rash noted. Not diaphoretic. No erythema. No pallor. MUSCULOSKELETAL: Normal range of motion. No edema noted. NEUROLOGIC: Alert and oriented to person, place, and time. Normal muscle tone coordination. No cranial nerve deficit noted. PSYCHIATRIC: Normal mood and affect.  Normal behavior. Normal judgment and thought content. CARDIOVASCULAR: Normal heart rate noted RESPIRATORY: Effort and breath sounds normal, no problems with respiration noted ABDOMEN: No masses noted. No other overt distention noted.   PELVIC: Deferred  Labs and Imaging No results found for this or any previous visit (from the past 168 hour(s)). MM 3D SCREEN BREAST BILATERAL  Result Date: 03/10/2022 CLINICAL DATA:  Screening. EXAM: DIGITAL SCREENING BILATERAL MAMMOGRAM WITH TOMOSYNTHESIS AND CAD TECHNIQUE: Bilateral screening digital craniocaudal and mediolateral oblique mammograms were obtained. Bilateral screening digital breast tomosynthesis was performed. The images were evaluated with computer-aided detection. COMPARISON:  Previous exam(s). ACR Breast Density Category c: The breast tissue is heterogeneously dense, which may obscure small masses. FINDINGS: There are no findings suspicious for malignancy. IMPRESSION: No mammographic evidence of malignancy. A result letter of this screening mammogram will be mailed directly to the patient. RECOMMENDATION: Screening mammogram in one year. (Code:SM-B-01Y) BI-RADS CATEGORY  1: Negative. Electronically Signed   By: 03/12/2022 M.D.   On: 03/10/2022 09:31   03/12/2022 Pelvic Complete With Transvaginal  Result Date: 03/09/2022 CLINICAL DATA:  Dysfunctional uterine bleeding EXAM: TRANSABDOMINAL AND TRANSVAGINAL ULTRASOUND OF PELVIS TECHNIQUE: Both transabdominal and transvaginal ultrasound examinations of the pelvis were performed. Transabdominal technique was performed for global imaging of the pelvis including uterus, ovaries, adnexal regions, and pelvic cul-de-sac. It was necessary to proceed with endovaginal exam following the transabdominal exam to visualize the uterus endometrium adnexa. COMPARISON:  None Available. FINDINGS: Uterus Measurements: 7.5 x 3.6 x  4.8 cm = volume: 68.2 mL. No fibroids or other mass visualized. Endometrium Thickness: 9.4 mm.  No  focal abnormality visualized. Right ovary Measurements: 2.9 x 1.8 x 2 cm = volume: 5.5 mL. Normal appearance/no adnexal mass. Left ovary Measurements: 4.2 x 1.3 x 1.7 cm = volume: 4.7 mL. Normal appearance/no adnexal mass. Nonspecific prominent left adnexal vessels Other findings No abnormal free fluid. IMPRESSION: 1. Endometrial thickness of 9.4 mm. If bleeding remains unresponsive to hormonal or medical therapy, sonohysterogram should be considered for focal lesion work-up. (Ref: Radiological Reasoning: Algorithmic Workup of Abnormal Vaginal Bleeding with Endovaginal Sonography and Sonohysterography. AJR 2008; [id number]) 2. Nonspecific prominent left adnexal vessels, could be seen with pelvic congestion. Electronically Signed   By: Donavan Foil M.D.   On: 03/09/2022 23:16      Assessment and Plan:     1. Encounter to discuss ultrasound results 2. DUB (dysfunctional uterine bleeding) Reviewed ultrasound with patient, this is not concerning. Her endometrial stripe is normal for a menstruating female, no abnormalities. Prominent left adnexal vessels are seen sporadically on many scans, not concerning and not diagnostic of pelvic congestion especially with no other symptoms. Patient was reassured.    As for her DUB, discussed possible management with progesterone oral therapy. This could help suppress her periods and may help her migraines (estrogen is contraindicated given history of aura). She will research this, discuss with neurologist and let us know.  Routine preventative health maintenance measures emphasized. Please refer to After Visit Summary for other counseling recommendations.   Return for any gynecologic concerns.    I spent 30 minutes dedicated to the care of this patient including pre-visit review of records, face to face time with the patient discussing her conditions and treatments and post visit orders.    Verita Schneiders, MD, [place] [employer], [place]

## 2022-05-09 ENCOUNTER — Telehealth: Payer: Self-pay | Admitting: Neurology

## 2022-05-09 ENCOUNTER — Other Ambulatory Visit: Payer: Self-pay | Admitting: Physician Assistant

## 2022-05-09 DIAGNOSIS — G43109 Migraine with aura, not intractable, without status migrainosus: Secondary | ICD-10-CM

## 2022-05-09 DIAGNOSIS — R110 Nausea: Secondary | ICD-10-CM

## 2022-05-09 MED ORDER — OSELTAMIVIR PHOSPHATE 75 MG PO CAPS: 75.0000 mg | ORAL_CAPSULE | Freq: Every day | ORAL | 0 refills | Status: DC

## 2022-05-09 NOTE — Telephone Encounter (Signed)
Patient called and states his kids have been diagnosed with Flu. She is currently asymptomatic. Per protocol, Tamiflu sent for once daily for 10 days to pharmacy.   Lesly Rubenstein - FYI

## 2022-06-20 ENCOUNTER — Telehealth (INDEPENDENT_AMBULATORY_CARE_PROVIDER_SITE_OTHER): Payer: BC Managed Care – PPO | Admitting: Physician Assistant

## 2022-06-20 ENCOUNTER — Encounter: Payer: Self-pay | Admitting: Physician Assistant

## 2022-06-20 VITALS — Temp 98.7°F | Ht 67.0 in | Wt 167.0 lb

## 2022-06-20 DIAGNOSIS — J0100 Acute maxillary sinusitis, unspecified: Secondary | ICD-10-CM

## 2022-06-20 MED ORDER — AZITHROMYCIN 250 MG PO TABS: ORAL_TABLET | ORAL | 0 refills | Status: AC

## 2022-06-20 NOTE — Progress Notes (Signed)
..Virtual Visit via Video Note  I connected with Laurie Velasquez on 06/20/22 at 11:30 AM EST by a video enabled telemedicine application and verified that I am speaking with the correct person using two identifiers.  Location: Patient: work Provider: clinic  .Marland KitchenParticipating in visit:  Patient: Laurie Velasquez Provider: Iran Planas PA-C   I discussed the limitations of evaluation and management by telemedicine and the availability of in person appointments. The patient expressed understanding and agreed to proceed.  History of Present Illness: Pt is a 42 yo female who calls in with URI symptoms for the last week. She has had lots of sinus congestion and drainage with pressure. She is blowing out green discharge. No fever, chills, body aches. She has been very tired. No SOB. She has had some cough. She is taking sudafed, mucinex, nyquil with some relief. Left facial pressure is worse than right.   .. 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   Right ear pain 07/16/2019   Mastoid pain, right 07/16/2019   Dizziness 11/09/2019   Smoker 03/03/2020   COVID-19 virus infection 03/03/2020   Overweight (BMI 25.0-29.9) 03/03/2020   Seasonal allergies 08/03/2020   Myalgia 09/17/2020   Positive ANA (antinuclear antibody) 09/17/2020   Polyarthralgia 09/17/2020   Chronic fatigue 09/17/2020   Migraine with aura and without status migrainosus, not  intractable 01/05/2021   Nausea 09/01/2021   DUB (dysfunctional uterine bleeding) 01/11/2022   Night sweats 01/11/2022   Mood swings 01/11/2022   Resolved Ambulatory Problems    Diagnosis Date Noted   Back pain 06/15/2014   Pregnancy 07/06/2015   Past Medical History:  Diagnosis Date   Arthritis    Migraine with aura    Vaginal Pap smear, abnormal        Observations/Objective: No acute distress Normal breathing Normal mood and appearance  .Marland Kitchen Today's Vitals   06/20/22 1111  Temp: 98.7 F (37.1 C)  TempSrc: Temporal  Weight: 167 lb (75.8 kg)  Height: 5\' 7"  (1.702 m)   Body mass index is 26.16 kg/m.    Assessment and Plan: Marland KitchenMarland KitchenShylynn was seen today for head cold  and sinus problem.  Diagnoses and all orders for this visit:  Acute non-recurrent maxillary sinusitis -     azithromycin (ZITHROMAX) 250 MG tablet; Take 2 tablets on day 1, then 1 tablet daily on days 2 through 5   URI symptoms for the last week and symptoms worsening Sent zpak  Consider flonase OTC as well Continue mucinex for the next few days Follow up as needed or if symptoms persist or worsen   Follow Up Instructions:    I discussed the assessment and treatment plan with the patient. The patient was provided an opportunity to ask questions and all were answered. The patient agreed with the plan and demonstrated an understanding of the instructions.   The patient was advised to call back or seek an in-person evaluation if the symptoms worsen or if the condition fails  to improve as anticipated.  Iran Planas, PA-C

## 2022-07-20 ENCOUNTER — Other Ambulatory Visit: Payer: Self-pay | Admitting: Neurology

## 2022-07-20 DIAGNOSIS — F419 Anxiety disorder, unspecified: Secondary | ICD-10-CM

## 2022-07-20 DIAGNOSIS — Z566 Other physical and mental strain related to work: Secondary | ICD-10-CM

## 2022-07-20 DIAGNOSIS — F458 Other somatoform disorders: Secondary | ICD-10-CM

## 2022-07-20 DIAGNOSIS — M26623 Arthralgia of bilateral temporomandibular joint: Secondary | ICD-10-CM

## 2022-07-20 MED ORDER — LORAZEPAM 0.5 MG PO TABS: ORAL_TABLET | ORAL | 2 refills | Status: DC

## 2022-07-20 NOTE — Telephone Encounter (Signed)
Last written 01/10/2022 #30 with 2 refills  Last appt 06/20/2022 (acute) 01/10/2022 (regular visit)

## 2022-07-27 ENCOUNTER — Encounter: Payer: Self-pay | Admitting: Physician Assistant

## 2022-07-28 MED ORDER — BUTALBITAL-APAP-CAFFEINE 50-325-40 MG PO TABS: 1.0000 | ORAL_TABLET | Freq: Four times a day (QID) | ORAL | 0 refills | Status: DC | PRN

## 2022-09-23 ENCOUNTER — Other Ambulatory Visit: Payer: Self-pay | Admitting: Physician Assistant

## 2022-09-23 DIAGNOSIS — G43109 Migraine with aura, not intractable, without status migrainosus: Secondary | ICD-10-CM

## 2023-02-21 ENCOUNTER — Encounter: Payer: Self-pay | Admitting: Physician Assistant

## 2023-02-21 MED ORDER — BUTALBITAL-APAP-CAFFEINE 50-325-40 MG PO TABS: 1.0000 | ORAL_TABLET | Freq: Four times a day (QID) | ORAL | 0 refills | Status: DC | PRN

## 2023-02-23 ENCOUNTER — Other Ambulatory Visit: Payer: Self-pay

## 2023-02-23 DIAGNOSIS — G43109 Migraine with aura, not intractable, without status migrainosus: Secondary | ICD-10-CM

## 2023-02-23 MED ORDER — NURTEC 75 MG PO TBDP: ORAL_TABLET | ORAL | 0 refills | Status: DC

## 2023-02-23 NOTE — Telephone Encounter (Signed)
Med refill

## 2023-02-28 ENCOUNTER — Encounter: Payer: Self-pay | Admitting: Physician Assistant

## 2023-02-28 ENCOUNTER — Ambulatory Visit: Payer: BC Managed Care – PPO | Admitting: Physician Assistant

## 2023-02-28 VITALS — BP 134/79 | HR 99 | Ht 67.0 in | Wt 172.0 lb

## 2023-02-28 DIAGNOSIS — M545 Low back pain, unspecified: Secondary | ICD-10-CM

## 2023-02-28 DIAGNOSIS — F331 Major depressive disorder, recurrent, moderate: Secondary | ICD-10-CM

## 2023-02-28 DIAGNOSIS — M26622 Arthralgia of left temporomandibular joint: Secondary | ICD-10-CM | POA: Diagnosis not present

## 2023-02-28 DIAGNOSIS — R110 Nausea: Secondary | ICD-10-CM | POA: Diagnosis not present

## 2023-02-28 DIAGNOSIS — Z23 Encounter for immunization: Secondary | ICD-10-CM

## 2023-02-28 DIAGNOSIS — H9202 Otalgia, left ear: Secondary | ICD-10-CM | POA: Diagnosis not present

## 2023-02-28 DIAGNOSIS — M26629 Arthralgia of temporomandibular joint, unspecified side: Secondary | ICD-10-CM

## 2023-02-28 DIAGNOSIS — G43109 Migraine with aura, not intractable, without status migrainosus: Secondary | ICD-10-CM | POA: Diagnosis not present

## 2023-02-28 DIAGNOSIS — G8929 Other chronic pain: Secondary | ICD-10-CM

## 2023-02-28 MED ORDER — BACLOFEN 10 MG PO TABS
10.0000 mg | ORAL_TABLET | Freq: Every day | ORAL | 1 refills | Status: DC
Start: 2023-02-28 — End: 2023-12-14

## 2023-02-28 MED ORDER — SERTRALINE HCL 100 MG PO TABS: ORAL_TABLET | ORAL | 3 refills | Status: DC

## 2023-02-28 MED ORDER — PROMETHAZINE HCL 25 MG PO TABS: 25.0000 mg | ORAL_TABLET | Freq: Four times a day (QID) | ORAL | 2 refills | Status: AC | PRN

## 2023-02-28 MED ORDER — DICLOFENAC SODIUM 75 MG PO TBEC: 75.0000 mg | DELAYED_RELEASE_TABLET | Freq: Two times a day (BID) | ORAL | 3 refills | Status: DC

## 2023-02-28 MED ORDER — UBRELVY 50 MG PO TABS: 1.0000 | ORAL_TABLET | ORAL | 3 refills | Status: DC | PRN

## 2023-02-28 MED ORDER — QULIPTA 10 MG PO TABS: 1.0000 | ORAL_TABLET | Freq: Every day | ORAL | 2 refills | Status: DC

## 2023-02-28 NOTE — Progress Notes (Signed)
Established Patient Office Visit  Subjective   Patient ID: Laurie Velasquez, female    DOB: 1980/11/24  Age: 42 y.o. MRN: [id number]  Chief Complaint  Patient presents with   Medical Management of Chronic Issues    Migranes fmla paperwork    HPI Pt is a 42 yo female who presents to the clinic for medication follow up.   Her insurance will not pay for nurtec anymore. She needs new rx. It was controlling her migraines and she now has already had 4 in the last 6 weeks of school. Stress is a trigger for her migraines.   She also has TMJ and chronic low back pain. Needs refills of baclofen and voltaren. She has noticed her back pain worsening since she has started to gain her weight back after she lost it on optiva.    Patient Active Problem List   Diagnosis Date Noted   Chronic bilateral low back pain without sciatica 02/28/2023   DUB (dysfunctional uterine bleeding) 01/11/2022   Night sweats 01/11/2022   Mood swings 01/11/2022   Nausea 09/01/2021   Migraine with aura and without status migrainosus, not intractable 01/05/2021   Myalgia 09/17/2020   Positive ANA (antinuclear antibody) 09/17/2020   Polyarthralgia 09/17/2020   Chronic fatigue 09/17/2020   Seasonal allergies 08/03/2020   Smoker 03/03/2020   COVID-19 virus infection 03/03/2020   Overweight (BMI 25.0-29.9) 03/03/2020   Dizziness 11/09/2019   TMJ tenderness, right 07/16/2019   TMJ syndrome 07/16/2019   Right ear pain 07/16/2019   Mastoid pain, right 07/16/2019   Teeth grinding 07/01/2019   Bilateral temporomandibular joint pain 07/01/2019   Suspected COVID-19 virus infection 06/30/2019   Trouble in sleeping 01/15/2019   Anxiety 01/15/2019   Stress at work 01/15/2019   Influenza-like illness 08/13/2017   Panic attacks 07/06/2015   Depression 07/06/2015   Nausea without vomiting 07/06/2015   Facet hypertrophy of lumbar region 08/28/2014   GAD (generalized anxiety disorder) 06/17/2014   Sacroiliac joint dysfunction  of both sides 06/11/2014   Past Medical History:  Diagnosis Date   Anxiety    Arthritis    Migraine with aura    Vaginal Pap smear, abnormal    Past Surgical History:  Procedure Laterality Date   CESAREAN SECTION     DILATION AND CURETTAGE OF UTERUS     LEEP     TUBAL LIGATION     Family History  Problem Relation Age of Onset   Arthritis Mother    Allergies  Allergen Reactions   Penicillins     "allergic since was little"       ROS See HPI.    Objective:     BP 134/79   Pulse 99   Ht 5\' 7"  (1.702 m)   Wt 172 lb (78 kg)   SpO2 98%   BMI 26.94 kg/m  BP Readings from Last 3 Encounters:  02/28/23 134/79  04/06/22 123/77  08/31/21 119/79   Wt Readings from Last 3 Encounters:  02/28/23 172 lb (78 kg)  06/20/22 167 lb (75.8 kg)  04/06/22 167 lb (75.8 kg)    .Marland Kitchen    03/02/2023    3:52 PM 06/20/2022   11:15 AM 01/10/2022   11:13 AM 08/31/2021    4:06 PM 01/05/2021    9:54 AM  Depression screen PHQ 2/9  Decreased Interest 0 0 1 0 0  Down, Depressed, Hopeless 0 0 0 0 0  PHQ - 2 Score 0 0 1 0 0  Altered sleeping  0  3 2 3   Tired, decreased energy 0  3 2 2   Change in appetite 0  3 2 1   Feeling bad or failure about yourself  0  0 0 1  Trouble concentrating 0  1 2 1   Moving slowly or fidgety/restless 0  0 1 2  Suicidal thoughts 0  0 0 0  PHQ-9 Score 0  11 9 10   Difficult doing work/chores Not difficult at all  Somewhat difficult Somewhat difficult Somewhat difficult   .Marland Kitchen    03/02/2023    3:52 PM 01/10/2022   11:15 AM 08/31/2021    4:07 PM 01/05/2021    9:55 AM  GAD 7 : Generalized Anxiety Score  Nervous, Anxious, on Edge 1 1 1 1   Control/stop worrying 1 1 1 1   Worry too much - different things 1 1 1 1   Trouble relaxing 1 0 0 0  Restless 1 0 0 0  Easily annoyed or irritable 1 3 1 2   Afraid - awful might happen 1 1 1 1   Total GAD 7 Score 7 7 5 6   Anxiety Difficulty Somewhat difficult Somewhat difficult Somewhat difficult Somewhat difficult      Physical  Exam Constitutional:      Appearance: Normal appearance.  HENT:     Head: Normocephalic.  Cardiovascular:     Rate and Rhythm: Normal rate and regular rhythm.  Pulmonary:     Effort: Pulmonary effort is normal.     Breath sounds: Normal breath sounds.  Musculoskeletal:     Right lower leg: No edema.     Left lower leg: No edema.  Neurological:     General: No focal deficit present.     Mental Status: She is alert and oriented to person, place, and time.  Psychiatric:        Mood and Affect: Mood normal.       The 10-year ASCVD risk score (Arnett DK, et al., 2019) is: 0.7%    Assessment & Plan:  Marland KitchenMarland KitchenTonnetta was seen today for medical management of chronic issues.  Diagnoses and all orders for this visit:  Migraine with aura and without status migrainosus, not intractable -     Ubrogepant (UBRELVY) 50 MG TABS; Take 1 tablet (50 mg total) by mouth as needed (for migraine). -     Atogepant (QULIPTA) 10 MG TABS; Take 1 tablet by mouth daily. -     promethazine (PHENERGAN) 25 MG tablet; Take 1 tablet (25 mg total) by mouth every 6 (six) hours as needed for nausea or vomiting.  Immunization due -     Flu vaccine trivalent PF, 6mos and older(Flulaval,Afluria,Fluarix,Fluzone)  Nausea -     promethazine (PHENERGAN) 25 MG tablet; Take 1 tablet (25 mg total) by mouth every 6 (six) hours as needed for nausea or vomiting.  TMJ tenderness, left -     diclofenac (VOLTAREN) 75 MG EC tablet; Take 1 tablet (75 mg total) by mouth 2 (two) times daily. -     baclofen (LIORESAL) 10 MG tablet; Take 1 tablet (10 mg total) by mouth at bedtime.  TMJ syndrome -     diclofenac (VOLTAREN) 75 MG EC tablet; Take 1 tablet (75 mg total) by mouth 2 (two) times daily. -     baclofen (LIORESAL) 10 MG tablet; Take 1 tablet (10 mg total) by mouth at bedtime.  Left ear pain -     diclofenac (VOLTAREN) 75 MG EC tablet; Take 1 tablet (75 mg total) by mouth  2 (two) times daily. -     baclofen (LIORESAL) 10 MG  tablet; Take 1 tablet (10 mg total) by mouth at bedtime.  Moderate episode of recurrent major depressive disorder (HCC) -     sertraline (ZOLOFT) 100 MG tablet; TAKE 1 TABLET BY MOUTH DAILY  Chronic bilateral low back pain without sciatica -     diclofenac (VOLTAREN) 75 MG EC tablet; Take 1 tablet (75 mg total) by mouth 2 (two) times daily.   Insurance will not pay for nurtec Trial of qulipta and ubrelvy for rescue Phenergan as needed  Voltaren and baclofen given for TMJ pain  Discussed wall pilates to build core strength  PHQ no concerns GAD stable Refilled zoloft    Return in about 3 months (around 05/31/2023).    Tandy Gaw, PA-C

## 2023-03-02 ENCOUNTER — Encounter: Payer: Self-pay | Admitting: Physician Assistant

## 2023-03-06 ENCOUNTER — Other Ambulatory Visit: Payer: Self-pay | Admitting: Physician Assistant

## 2023-03-06 DIAGNOSIS — G43109 Migraine with aura, not intractable, without status migrainosus: Secondary | ICD-10-CM

## 2023-03-06 MED ORDER — UBRELVY 50 MG PO TABS: 1.0000 | ORAL_TABLET | ORAL | 3 refills | Status: DC | PRN

## 2023-04-09 ENCOUNTER — Encounter: Payer: Self-pay | Admitting: Physician Assistant

## 2023-04-09 ENCOUNTER — Ambulatory Visit: Payer: BC Managed Care – PPO | Admitting: Physician Assistant

## 2023-04-09 VITALS — BP 135/83 | HR 98 | Temp 99.9°F | Ht 67.0 in | Wt 172.0 lb

## 2023-04-09 DIAGNOSIS — J40 Bronchitis, not specified as acute or chronic: Secondary | ICD-10-CM

## 2023-04-09 DIAGNOSIS — R6889 Other general symptoms and signs: Secondary | ICD-10-CM | POA: Diagnosis not present

## 2023-04-09 DIAGNOSIS — J329 Chronic sinusitis, unspecified: Secondary | ICD-10-CM

## 2023-04-09 LAB — POC COVID19 BINAXNOW: SARS Coronavirus 2 Ag: NEGATIVE

## 2023-04-09 LAB — POCT INFLUENZA A/B
Influenza A, POC: NEGATIVE
Influenza B, POC: NEGATIVE

## 2023-04-09 LAB — POCT RAPID STREP A (OFFICE): Rapid Strep A Screen: NEGATIVE

## 2023-04-09 MED ORDER — HYDROCOD POLI-CHLORPHE POLI ER 10-8 MG/5ML PO SUER
5.0000 mL | Freq: Two times a day (BID) | ORAL | 0 refills | Status: DC | PRN
Start: 2023-04-09 — End: 2023-12-14

## 2023-04-09 MED ORDER — METHYLPREDNISOLONE 4 MG PO TBPK
ORAL_TABLET | ORAL | 0 refills | Status: DC
Start: 2023-04-09 — End: 2023-12-14

## 2023-04-09 MED ORDER — AZITHROMYCIN 250 MG PO TABS
ORAL_TABLET | ORAL | 0 refills | Status: DC
Start: 2023-04-09 — End: 2023-12-14

## 2023-04-09 NOTE — Progress Notes (Signed)
Acute Office Visit  Subjective:     Patient ID: Laurie Velasquez, female    DOB: August 02, 1980, 42 y.o.   MRN: [id number]  Chief Complaint  Patient presents with   Cough    Cough Associated symptoms include chills, a fever, a sore throat and shortness of breath. Pertinent negatives include no chest pain, ear pain or wheezing.   Patient presents today for dry cough that started 1 wk ago. She also reports sore throat, fatigue, trouble sleeping, SOB, congestion, and sinus pressure. She is a Psychologist, forensic. She has not done an at home COVID test. Had COVID in late September 2024 and symptoms are not similar. Got a flu shot this flu season. Has been taking Nyquil, Mucinex, and nasal decongestant spray which are not working. Yesterday used albuterol at night which she is unsure worked. She denies anyone in her family with similar symptoms.   Review of Systems  Constitutional:  Positive for chills, fever and malaise/fatigue.  HENT:  Positive for congestion, sinus pain and sore throat. Negative for ear discharge and ear pain.   Respiratory:  Positive for cough and shortness of breath. Negative for sputum production and wheezing.   Cardiovascular:  Negative for chest pain.  Gastrointestinal:  Positive for constipation. Negative for abdominal pain, diarrhea, nausea and vomiting.  Neurological:  Positive for dizziness.        Objective:    BP 135/83   Pulse 98   Temp 99.9 F (37.7 C) (Oral)   Ht 5\' 7"  (1.702 m)   Wt 78 kg   SpO2 98%   BMI 26.94 kg/m    Physical Exam Constitutional:      Appearance: Normal appearance.  HENT:     Head: Normocephalic and atraumatic.     Right Ear: Hearing, ear canal and external ear normal. No drainage, swelling or tenderness. A middle ear effusion is present. Tympanic membrane is not perforated, erythematous or bulging.     Left Ear: Ear canal and external ear normal. No drainage, swelling or tenderness. A middle ear effusion is present. Tympanic  membrane is not perforated, erythematous or bulging.     Nose: Congestion present. No nasal tenderness or rhinorrhea.     Right Turbinates: Pale.     Left Turbinates: Pale.     Right Sinus: Maxillary sinus tenderness and frontal sinus tenderness present.     Left Sinus: Maxillary sinus tenderness and frontal sinus tenderness present.     Mouth/Throat:     Mouth: No oral lesions.     Pharynx: Postnasal drip (clear) present. No pharyngeal swelling, oropharyngeal exudate, posterior oropharyngeal erythema or uvula swelling.     Tonsils: No tonsillar exudate.  Eyes:     Conjunctiva/sclera: Conjunctivae normal.     Pupils: Pupils are equal, round, and reactive to light.  Cardiovascular:     Rate and Rhythm: Normal rate and regular rhythm.     Heart sounds:     Friction rub present.  Pulmonary:     Effort: Pulmonary effort is normal.     Breath sounds: Normal breath sounds.  Lymphadenopathy:     Cervical: Cervical adenopathy present.  Neurological:     Mental Status: She is alert.     Results for orders placed or performed in visit on 04/09/23  POCT rapid strep A  Result Value Ref Range   Rapid Strep A Screen Negative Negative  POCT Influenza A/B  Result Value Ref Range   Influenza A, POC Negative Negative  Influenza B, POC Negative Negative  POC COVID-19  Result Value Ref Range   SARS Coronavirus 2 Ag Negative Negative        Assessment & Plan:   Shaaron was seen today for cough.  Diagnoses and all orders for this visit:  Flu-like symptoms -     chlorpheniramine-HYDROcodone (TUSSIONEX) 10-8 MG/5ML; Take 5 mLs by mouth every 12 (twelve) hours as needed. -     azithromycin (ZITHROMAX Z-PAK) 250 MG tablet; Take 2 tablets (500 mg) on  Day 1,  followed by 1 tablet (250 mg) once daily on Days 2 through 5. -     methylPREDNISolone (MEDROL DOSEPAK) 4 MG TBPK tablet; Take as directed by package insert. -     POCT rapid strep A -     POCT Influenza A/B -     POC COVID-19 Advised  patient to take antibiotic if not feeling better in one day with the cough syrup.      AnnaCollin M Mace, Student-PA

## 2023-04-09 NOTE — Patient Instructions (Signed)
Acute Bronchitis, Adult  Acute bronchitis is when air tubes in the lungs (bronchi) suddenly get swollen. The condition can make it hard for you to breathe. In adults, acute bronchitis usually goes away within 2 weeks. A cough caused by bronchitis may last up to 3 weeks. Smoking, allergies, and asthma can make the condition worse. What are the causes? Germs that cause cold and flu (viruses). The most common cause of this condition is the virus that causes the common cold. Bacteria. Substances that bother (irritate) the lungs, including: Smoke from cigarettes and other types of tobacco. Dust and pollen. Fumes from chemicals, gases, or burned fuel. Indoor or outdoor air pollution. What increases the risk? A weak body's defense system. This is also called the immune system. Any condition that affects your lungs and breathing, such as asthma. What are the signs or symptoms? A cough. Coughing up clear, yellow, or green mucus. Making high-pitched whistling sounds when you breathe, most often when you breathe out (wheezing). Runny or stuffy nose. Having too much mucus in your lungs (chest congestion). Shortness of breath. Body aches. A sore throat. How is this treated? Acute bronchitis may go away over time without treatment. Your doctor may tell you to: Drink more fluids. This will help thin your mucus so it is easier to cough up. Use a device that gets medicine into your lungs (inhaler). Use a vaporizer or a humidifier. These are machines that add water to the air. This helps with coughing and poor breathing. Take a medicine that thins mucus and helps clear it from your lungs. Take a medicine that prevents or stops coughing. It is not common to take an antibiotic medicine for this condition. Follow these instructions at home:  Take over-the-counter and prescription medicines only as told by your doctor. Use an inhaler, vaporizer, or humidifier as told by your doctor. Take two teaspoons  (10 mL) of honey at bedtime. This helps lessen your coughing at night. Drink enough fluid to keep your pee (urine) pale yellow. Do not smoke or use any products that contain nicotine or tobacco. If you need help quitting, ask your doctor. Get a lot of rest. Return to your normal activities when your doctor says that it is safe. Keep all follow-up visits. How is this prevented?  Wash your hands often with soap and water for at least 20 seconds. If you cannot use soap and water, use hand sanitizer. Avoid contact with people who have cold symptoms. Try not to touch your mouth, nose, or eyes with your hands. Avoid breathing in smoke or chemical fumes. Make sure to get the flu shot every year. Contact a doctor if: Your symptoms do not get better in 2 weeks. You have trouble coughing up the mucus. Your cough keeps you awake at night. You have a fever. Get help right away if: You cough up blood. You have chest pain. You have very bad shortness of breath. You faint or keep feeling like you are going to faint. You have a very bad headache. Your fever or chills get worse. These symptoms may be an emergency. Get help right away. Call your local emergency services (911 in the [place] Do not wait to see if the symptoms will go away. Do not drive yourself to the hospital. Summary Acute bronchitis is when air tubes in the lungs (bronchi) suddenly get swollen. In adults, acute bronchitis usually goes away within 2 weeks. Drink more fluids. This will help thin your mucus so it is easier  to cough up. Take over-the-counter and prescription medicines only as told by your doctor. Contact a doctor if your symptoms do not improve after 2 weeks of treatment. This information is not intended to replace advice given to you by your health care provider. Make sure you discuss any questions you have with your health care provider. Document Revised: 09/15/2020 Document Reviewed: 09/15/2020 Elsevier Patient  Education  2024 [employer].

## 2023-12-14 ENCOUNTER — Ambulatory Visit (INDEPENDENT_AMBULATORY_CARE_PROVIDER_SITE_OTHER): Payer: Self-pay | Admitting: Physician Assistant

## 2023-12-14 DIAGNOSIS — F419 Anxiety disorder, unspecified: Secondary | ICD-10-CM

## 2023-12-14 DIAGNOSIS — F331 Major depressive disorder, recurrent, moderate: Secondary | ICD-10-CM

## 2023-12-14 DIAGNOSIS — F4323 Adjustment disorder with mixed anxiety and depressed mood: Secondary | ICD-10-CM

## 2023-12-14 DIAGNOSIS — M542 Cervicalgia: Secondary | ICD-10-CM

## 2023-12-14 DIAGNOSIS — Z13220 Encounter for screening for lipoid disorders: Secondary | ICD-10-CM

## 2023-12-14 DIAGNOSIS — Z131 Encounter for screening for diabetes mellitus: Secondary | ICD-10-CM

## 2023-12-14 DIAGNOSIS — R635 Abnormal weight gain: Secondary | ICD-10-CM

## 2023-12-14 DIAGNOSIS — E663 Overweight: Secondary | ICD-10-CM

## 2023-12-14 DIAGNOSIS — M549 Dorsalgia, unspecified: Secondary | ICD-10-CM

## 2023-12-14 DIAGNOSIS — G43109 Migraine with aura, not intractable, without status migrainosus: Secondary | ICD-10-CM

## 2023-12-14 MED ORDER — NALTREXONE-BUPROPION HCL ER 8-90 MG PO TB12: 2.0000 | ORAL_TABLET | Freq: Two times a day (BID) | ORAL | 2 refills | Status: DC

## 2023-12-14 MED ORDER — BACLOFEN 10 MG PO TABS: 10.0000 mg | ORAL_TABLET | Freq: Every day | ORAL | 1 refills | Status: DC

## 2023-12-14 MED ORDER — LORAZEPAM 0.5 MG PO TABS
ORAL_TABLET | ORAL | 0 refills | Status: DC
Start: 2023-12-14 — End: 2024-01-25

## 2023-12-14 MED ORDER — KETOROLAC TROMETHAMINE 60 MG/2ML IM SOLN
60.0000 mg | Freq: Once | INTRAMUSCULAR | Status: AC
  Administered 2023-12-14: 60 mg via INTRAMUSCULAR

## 2023-12-14 MED ORDER — HYDROCODONE-ACETAMINOPHEN 5-325 MG PO TABS: ORAL_TABLET | ORAL | 0 refills | Status: DC

## 2023-12-14 MED ORDER — NURTEC 75 MG PO TBDP: 1.0000 | ORAL_TABLET | ORAL | 5 refills | Status: AC

## 2023-12-14 MED ORDER — NALTREXONE-BUPROPION HCL ER 8-90 MG PO TB12
ORAL_TABLET | ORAL | 0 refills | Status: DC
Start: 2023-12-14 — End: 2024-01-25

## 2023-12-14 MED ORDER — SERTRALINE HCL 100 MG PO TABS: ORAL_TABLET | ORAL | 1 refills | Status: AC

## 2023-12-14 NOTE — Patient Instructions (Addendum)
 Stop qulipta  and ubrelvy  and start nurtec every other day.  Continue ibuprofen as needed Consider massage Refilled baclofen  Ativan  as needed for anxiety and sleep Increased zoloft  to 150mg  daily Consider calm aid before driving Get fasting labs next week Will send for contrave to consider to mail order pharmacy
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
## 2023-12-17 ENCOUNTER — Encounter: Payer: Self-pay | Admitting: Physician Assistant

## 2023-12-17 DIAGNOSIS — R635 Abnormal weight gain: Secondary | ICD-10-CM | POA: Insufficient documentation

## 2023-12-28 ENCOUNTER — Other Ambulatory Visit (HOSPITAL_BASED_OUTPATIENT_CLINIC_OR_DEPARTMENT_OTHER): Payer: Self-pay

## 2023-12-28 ENCOUNTER — Other Ambulatory Visit (HOSPITAL_COMMUNITY): Payer: Self-pay

## 2023-12-28 ENCOUNTER — Telehealth: Payer: Self-pay

## 2023-12-28 NOTE — Telephone Encounter (Signed)
 Pharmacy Patient Advocate Encounter  Received notification from [facility] that Prior Authorization for Nurtec 75mg  tabs has been APPROVED from 12/28/23 to 12/27/24. Ran test claim, Copay is $0. This test claim was processed through [facility]- copay amounts may vary at other pharmacies due to pharmacy/plan contracts, or as the patient moves through the different stages of their insurance plan.   PA #/Case ID/Reference #: [id number]  Left a message at [facility] to notify of the approval and indexed approval letter to media tab.

## 2023-12-28 NOTE — Telephone Encounter (Signed)
 Pharmacy Patient Advocate Encounter   Received notification from Patient Pharmacy that prior authorization for Nurtec 75mg  tabs is required/requested.   Insurance verification completed.   The patient is insured through [facility] .   Per test claim: PA required; PA submitted to above mentioned insurance via CoverMyMeds Key/confirmation #/EOC [id number] Status is pending

## 2024-01-23 ENCOUNTER — Ambulatory Visit: Admitting: Physician Assistant

## 2024-01-23 VITALS — BP 120/85 | HR 68 | Ht 67.0 in | Wt 172.0 lb

## 2024-01-23 DIAGNOSIS — F410 Panic disorder [episodic paroxysmal anxiety] without agoraphobia: Secondary | ICD-10-CM

## 2024-01-23 DIAGNOSIS — G43109 Migraine with aura, not intractable, without status migrainosus: Secondary | ICD-10-CM | POA: Diagnosis not present

## 2024-01-23 DIAGNOSIS — F419 Anxiety disorder, unspecified: Secondary | ICD-10-CM

## 2024-01-23 DIAGNOSIS — G479 Sleep disorder, unspecified: Secondary | ICD-10-CM

## 2024-01-23 DIAGNOSIS — Z566 Other physical and mental strain related to work: Secondary | ICD-10-CM | POA: Diagnosis not present

## 2024-01-23 DIAGNOSIS — F331 Major depressive disorder, recurrent, moderate: Secondary | ICD-10-CM | POA: Diagnosis not present

## 2024-01-23 DIAGNOSIS — F4323 Adjustment disorder with mixed anxiety and depressed mood: Secondary | ICD-10-CM

## 2024-01-23 NOTE — Progress Notes (Unsigned)
   Established Patient Office Visit  Subjective   Patient ID: Laurie Velasquez, female    DOB: 12-21-1980  Age: 43 y.o. MRN: [id number]  No chief complaint on file.   HPI  {History (Optional):23778}  ROS    Objective:     There were no vitals taken for this visit. {Vitals History (Optional):23777}  Physical Exam   No results found for any visits on 01/23/24.  {Labs (Optional):23779}  The ASCVD Risk score (Arnett DK, et al., 2019) failed to calculate for the following reasons:   Cannot find a previous HDL lab   Cannot find a previous total cholesterol lab    Assessment & Plan:   Problem List Items Addressed This Visit   None   No follow-ups on file.    Vernadine Coombs, PA-C

## 2024-01-25 ENCOUNTER — Encounter: Payer: Self-pay | Admitting: Physician Assistant

## 2024-01-25 MED ORDER — LORAZEPAM 0.5 MG PO TABS
ORAL_TABLET | ORAL | 0 refills | Status: AC
Start: 2024-01-25 — End: ?

## 2024-01-25 MED ORDER — TRAZODONE HCL 50 MG PO TABS: 25.0000 mg | ORAL_TABLET | Freq: Every evening | ORAL | 1 refills | Status: DC | PRN

## 2024-03-19 ENCOUNTER — Ambulatory Visit: Payer: Self-pay | Admitting: *Deleted

## 2024-03-19 NOTE — Telephone Encounter (Signed)
 FYI Only or Action Required?: Action required by provider: request for appointment, update on patient condition, and requesting earlier appt today .  Patient was last seen in primary care on 01/23/2024 by Antoniette Vermell CROME, PA-C.  Called Nurse Triage reporting Migraine.  Symptoms began several days ago. Sunday   Interventions attempted: Prescription medications: nurtec and brelvy.  Symptoms are: unchanged.  Triage Disposition: See Physician Within 24 Hours  Patient/caregiver understands and will follow disposition?: Yes               Copied from CRM #[id number]. Topic: Clinical - Red Word Triage >> Mar 19, 2024 11:56 AM Charlet HERO wrote: Red Word that prompted transfer to Nurse Triage: Patient is calling stating that she has had a migrane since Sunday and med is not helping she would like to get shot some time today. [facility] Reason for Disposition  [1] MODERATE headache (e.g., interferes with normal activities) AND [2] present > 24 hours AND [3] unexplained  (Exceptions: Pain medicines not tried, typical migraine, or headache part of viral illness.)  Answer Assessment - Initial Assessment Questions Appt scheduled for tomorrow with other provider. None available with PCP until next week. Patient requesting shot for migraine. Has been taken prescribed options with some relief but not lasting longer than 3-4 hours. Recommended if sx worsen go to UC/ED.        1. LOCATION: Where does it hurt?      At work now progressively worse. Whole left side of head eye to neck  2. ONSET: When did the headache start? (e.g., minutes, hours, days)      Sunday  3. PATTERN: Does the pain come and go, or has it been constant since it started?     Constant  4. SEVERITY: How bad is the pain? and What does it keep you from doing?  (e.g., Scale 1-10; mild, moderate, or severe)     6-7/10 5. RECURRENT SYMPTOM: Have you ever had headaches before? If Yes, ask: When was the last  time? and What happened that time?      Yes  6. CAUSE: What do you think is causing the headache?     Migraine  7. MIGRAINE: Have you been diagnosed with migraine headaches? If Yes, ask: Is this headache similar?      Yes  8. HEAD INJURY: Has there been any recent injury to your head?      na 9. OTHER SYMPTOMS: Do you have any other symptoms? (e.g., fever, stiff neck, eye pain, sore throat, cold symptoms)     Brevly , nurtec, cocktail yesterday. Only 3-4 hours of relief. Body aches lots of yawning.  10. PREGNANCY: Is there any chance you are pregnant? When was your last menstrual period?       na  Protocols used: Headache-A-AH

## 2024-03-19 NOTE — Telephone Encounter (Signed)
 The patient has been scheduled with on 03/20/24 with Dr. Alvan for the following symptoms.

## 2024-03-20 ENCOUNTER — Ambulatory Visit: Admitting: Family Medicine

## 2024-03-20 ENCOUNTER — Other Ambulatory Visit: Payer: Self-pay | Admitting: Physician Assistant

## 2024-03-20 DIAGNOSIS — G43109 Migraine with aura, not intractable, without status migrainosus: Secondary | ICD-10-CM

## 2024-04-30 ENCOUNTER — Encounter: Payer: Self-pay | Admitting: Physician Assistant

## 2024-04-30 DIAGNOSIS — G479 Sleep disorder, unspecified: Secondary | ICD-10-CM

## 2024-04-30 DIAGNOSIS — H9202 Otalgia, left ear: Secondary | ICD-10-CM

## 2024-04-30 DIAGNOSIS — M542 Cervicalgia: Secondary | ICD-10-CM

## 2024-04-30 DIAGNOSIS — M545 Low back pain, unspecified: Secondary | ICD-10-CM

## 2024-04-30 DIAGNOSIS — M26629 Arthralgia of temporomandibular joint, unspecified side: Secondary | ICD-10-CM

## 2024-04-30 DIAGNOSIS — M26622 Arthralgia of left temporomandibular joint: Secondary | ICD-10-CM

## 2024-04-30 DIAGNOSIS — M549 Dorsalgia, unspecified: Secondary | ICD-10-CM

## 2024-05-01 MED ORDER — DICLOFENAC SODIUM 75 MG PO TBEC: 75.0000 mg | DELAYED_RELEASE_TABLET | Freq: Two times a day (BID) | ORAL | 0 refills | Status: AC

## 2024-05-01 MED ORDER — BACLOFEN 10 MG PO TABS: 10.0000 mg | ORAL_TABLET | Freq: Every day | ORAL | 0 refills | Status: AC

## 2024-05-01 NOTE — Telephone Encounter (Signed)
 Patient requesting rx rf of  Trazadone 50mg  Last written 01/25/2024 Baclofen  10mg  Last written 12/14/2023 Diclofenac  175mg  Last written 02/28/2023 Last OV 01/23/2024  Sent patient a Mychart message  that an appt is needed.  Is it o.k. to send a 30 day supply of these medications to get the patient through until she can be seen ?

## 2024-05-02 ENCOUNTER — Telehealth: Admitting: Physician Assistant

## 2024-05-02 DIAGNOSIS — F419 Anxiety disorder, unspecified: Secondary | ICD-10-CM

## 2024-05-02 DIAGNOSIS — G479 Sleep disorder, unspecified: Secondary | ICD-10-CM | POA: Diagnosis not present

## 2024-05-02 DIAGNOSIS — F331 Major depressive disorder, recurrent, moderate: Secondary | ICD-10-CM | POA: Diagnosis not present

## 2024-05-02 DIAGNOSIS — M545 Low back pain, unspecified: Secondary | ICD-10-CM

## 2024-05-02 MED ORDER — TRAMADOL HCL 50 MG PO TABS: 50.0000 mg | ORAL_TABLET | Freq: Four times a day (QID) | ORAL | 0 refills | Status: AC | PRN

## 2024-05-02 MED ORDER — TRAZODONE HCL 50 MG PO TABS: 25.0000 mg | ORAL_TABLET | Freq: Every evening | ORAL | 1 refills | Status: AC | PRN

## 2024-05-02 NOTE — Progress Notes (Signed)
..  Virtual Visit via Video Note  I connected with Laurie Velasquez on 05/05/24 at  2:40 PM EST by a video enabled telemedicine application and verified that I am speaking with the correct person using two identifiers.  Location: Patient: home Provider: clinic  .SABRAParticipating in visit:  Patient: Laurie Velasquez Provider: Vermell Bologna PA-C   I discussed the limitations of evaluation and management by telemedicine and the availability of in person appointments. The patient expressed understanding and agreed to proceed.  History of Present Illness: .Discussed the use of AI scribe software for clinical note transcription with the patient, who gave verbal consent to proceed.  History of Present Illness Laurie Velasquez is a 43 year old female who presents with lower back pain radiating upwards and around the torso.  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 may be contributing factors  Pain management and medication use - Utilizes Tylenol  and ibuprofen for pain relief - Employs stretches, alternating ice and heat, and a TENS unit for symptom management - Recently ran out of baclofen  and diclofenac ; both have been refilled and are available for use  Sleep disturbance - Recently ran out of trazodone , which is helpful for sleep; will be refilled    Observations/Objective: No acute distress Normal mood and appearance   Assessment and Plan: SABRASABRADiagnoses and all orders for this visit:  Chronic bilateral low back pain  without sciatica -     traMADol  (ULTRAM ) 50 MG tablet; Take 1 tablet (50 mg total) by mouth every 6 (six) hours as needed for up to 5 days.  Anxiety  Moderate episode of recurrent major depressive disorder (HCC)  Trouble in sleeping -     traZODone  (DESYREL ) 50 MG tablet; Take 0.5-1 tablets (25-50 mg total) by mouth at bedtime as needed for sleep.    Assessment & Plan Chronic Low back pain with intermittent flares Chronic low back pain exacerbated by increased physical activity, likely muscular in origin due to lack of leg radiation. No red flags.  - Start diclofenac . - Prescribe tramadol  for break through pain.  - Recommend alternating ice and heat. - Suggest TENS unit and lidocaine patches. - Advise massage therapy. - Encourage hot baths and icy hot patches.  Sleep disorder Previously managed with trazodone , effective until medication ran out. - Refilled trazodone  prescription.    Follow Up Instructions:    I discussed the assessment and treatment plan with the patient. The patient was provided an opportunity to ask questions and all were answered. The patient agreed with the plan and demonstrated an understanding of the instructions.   The patient was advised to call back or seek an in-person evaluation if the symptoms worsen or if the condition fails to improve as anticipated.   Anette Barra, PA-C

## 2024-05-05 ENCOUNTER — Encounter: Payer: Self-pay | Admitting: Physician Assistant
